# Patient Record
Sex: Male | Born: 1970 | Race: Black or African American | Hispanic: No | Marital: Married | Smoking: Former smoker
Health system: Southern US, Community
[De-identification: ages and names within clinical notes are randomized; demographics above are authoritative.]

## PROBLEM LIST (undated history)

## (undated) DIAGNOSIS — K219 Gastro-esophageal reflux disease without esophagitis: Secondary | ICD-10-CM

## (undated) DIAGNOSIS — I2541 Coronary artery aneurysm: Secondary | ICD-10-CM

## (undated) DIAGNOSIS — T7840XA Allergy, unspecified, initial encounter: Secondary | ICD-10-CM

## (undated) HISTORY — DX: Allergy, unspecified, initial encounter: T78.40XA

## (undated) HISTORY — PX: KNEE SURGERY: SHX244

## (undated) HISTORY — PX: OTHER SURGICAL HISTORY: SHX169

---

## 1998-07-06 ENCOUNTER — Emergency Department (HOSPITAL_COMMUNITY): Admission: EM | Admit: 1998-07-06 | Discharge: 1998-07-06 | Payer: Self-pay | Admitting: Emergency Medicine

## 1998-07-06 ENCOUNTER — Encounter: Payer: Self-pay | Admitting: Emergency Medicine

## 1998-07-09 ENCOUNTER — Emergency Department (HOSPITAL_COMMUNITY): Admission: EM | Admit: 1998-07-09 | Discharge: 1998-07-09 | Payer: Self-pay | Admitting: Emergency Medicine

## 2001-02-11 ENCOUNTER — Emergency Department (HOSPITAL_COMMUNITY): Admission: EM | Admit: 2001-02-11 | Discharge: 2001-02-11 | Payer: Self-pay | Admitting: Emergency Medicine

## 2001-03-05 ENCOUNTER — Emergency Department (HOSPITAL_COMMUNITY): Admission: EM | Admit: 2001-03-05 | Discharge: 2001-03-05 | Payer: Self-pay | Admitting: Emergency Medicine

## 2001-06-27 ENCOUNTER — Emergency Department (HOSPITAL_COMMUNITY): Admission: EM | Admit: 2001-06-27 | Discharge: 2001-06-27 | Payer: Self-pay | Admitting: Emergency Medicine

## 2002-01-05 ENCOUNTER — Inpatient Hospital Stay (HOSPITAL_COMMUNITY): Admission: AD | Admit: 2002-01-05 | Discharge: 2002-01-06 | Payer: Self-pay | Admitting: Internal Medicine

## 2002-12-11 ENCOUNTER — Emergency Department (HOSPITAL_COMMUNITY): Admission: EM | Admit: 2002-12-11 | Discharge: 2002-12-11 | Payer: Self-pay | Admitting: *Deleted

## 2002-12-12 ENCOUNTER — Emergency Department (HOSPITAL_COMMUNITY): Admission: EM | Admit: 2002-12-12 | Discharge: 2002-12-12 | Payer: Self-pay | Admitting: Emergency Medicine

## 2003-01-23 ENCOUNTER — Emergency Department (HOSPITAL_COMMUNITY): Admission: EM | Admit: 2003-01-23 | Discharge: 2003-01-23 | Payer: Self-pay | Admitting: Emergency Medicine

## 2003-02-05 ENCOUNTER — Emergency Department (HOSPITAL_COMMUNITY): Admission: EM | Admit: 2003-02-05 | Discharge: 2003-02-05 | Payer: Self-pay | Admitting: Emergency Medicine

## 2003-04-12 ENCOUNTER — Emergency Department (HOSPITAL_COMMUNITY): Admission: EM | Admit: 2003-04-12 | Discharge: 2003-04-12 | Payer: Self-pay | Admitting: Emergency Medicine

## 2003-05-29 ENCOUNTER — Emergency Department (HOSPITAL_COMMUNITY): Admission: EM | Admit: 2003-05-29 | Discharge: 2003-05-29 | Payer: Self-pay | Admitting: Emergency Medicine

## 2003-06-20 ENCOUNTER — Emergency Department (HOSPITAL_COMMUNITY): Admission: EM | Admit: 2003-06-20 | Discharge: 2003-06-21 | Payer: Self-pay | Admitting: Emergency Medicine

## 2003-12-31 ENCOUNTER — Emergency Department (HOSPITAL_COMMUNITY): Admission: EM | Admit: 2003-12-31 | Discharge: 2004-01-01 | Payer: Self-pay | Admitting: Emergency Medicine

## 2007-02-21 ENCOUNTER — Emergency Department (HOSPITAL_COMMUNITY): Admission: EM | Admit: 2007-02-21 | Discharge: 2007-02-22 | Payer: Self-pay | Admitting: Emergency Medicine

## 2007-03-01 ENCOUNTER — Emergency Department (HOSPITAL_COMMUNITY): Admission: EM | Admit: 2007-03-01 | Discharge: 2007-03-01 | Payer: Self-pay | Admitting: Emergency Medicine

## 2007-10-05 ENCOUNTER — Observation Stay (HOSPITAL_COMMUNITY): Admission: EM | Admit: 2007-10-05 | Discharge: 2007-10-06 | Payer: Self-pay | Admitting: Emergency Medicine

## 2008-10-03 ENCOUNTER — Inpatient Hospital Stay (HOSPITAL_COMMUNITY): Admission: EM | Admit: 2008-10-03 | Discharge: 2008-10-06 | Payer: Self-pay | Admitting: Emergency Medicine

## 2009-01-01 ENCOUNTER — Emergency Department (HOSPITAL_COMMUNITY): Admission: EM | Admit: 2009-01-01 | Discharge: 2009-01-02 | Payer: Self-pay | Admitting: Emergency Medicine

## 2009-08-14 ENCOUNTER — Emergency Department (HOSPITAL_COMMUNITY): Admission: EM | Admit: 2009-08-14 | Discharge: 2009-08-14 | Payer: Self-pay | Admitting: Emergency Medicine

## 2010-12-02 LAB — URINALYSIS, ROUTINE W REFLEX MICROSCOPIC
Glucose, UA: NEGATIVE mg/dL
Leukocytes, UA: NEGATIVE
Nitrite: NEGATIVE
Protein, ur: 30 mg/dL — AB
Specific Gravity, Urine: 1.022 (ref 1.005–1.030)
Urobilinogen, UA: 1 mg/dL (ref 0.0–1.0)
pH: 6 (ref 5.0–8.0)

## 2010-12-02 LAB — URINE MICROSCOPIC-ADD ON

## 2010-12-02 LAB — BASIC METABOLIC PANEL
BUN: 6 mg/dL (ref 6–23)
CO2: 27 mEq/L (ref 19–32)
CO2: 29 mEq/L (ref 19–32)
Calcium: 8.3 mg/dL — ABNORMAL LOW (ref 8.4–10.5)
Calcium: 8.9 mg/dL (ref 8.4–10.5)
Chloride: 110 mEq/L (ref 96–112)
Creatinine, Ser: 1.32 mg/dL (ref 0.4–1.5)
GFR calc Af Amer: >60 mL/min (ref >60)
GFR calc Af Amer: >60 mL/min (ref >60)
GFR calc non Af Amer: >60 mL/min (ref >60)
GFR calc non Af Amer: >60 mL/min (ref >60)
Glucose, Bld: 94 mg/dL (ref 70–99)
Potassium: 3.8 mEq/L (ref 3.5–5.1)
Potassium: 4.2 mEq/L (ref 3.5–5.1)
Sodium: 138 mEq/L (ref 135–145)
Sodium: 141 mEq/L (ref 135–145)

## 2010-12-02 LAB — CBC
HCT: 41.1 % (ref 39.0–52.0)
HCT: 48.4 % (ref 39.0–52.0)
Hemoglobin: 13.8 g/dL (ref 13.0–17.0)
Hemoglobin: 16.1 g/dL (ref 13.0–17.0)
MCHC: 33.2 g/dL (ref 30.0–36.0)
MCHC: 33.6 g/dL (ref 30.0–36.0)
MCV: 95.8 fL (ref 78.0–100.0)
MCV: 96 fL (ref 78.0–100.0)
Platelets: 190 10*3/uL (ref 150–400)
Platelets: 229 10*3/uL (ref 150–400)
RBC: 4.29 MIL/uL (ref 4.22–5.81)
RBC: 5.06 MIL/uL (ref 4.22–5.81)
RDW: 13.9 % (ref 11.5–15.5)
RDW: 14.1 % (ref 11.5–15.5)
WBC: 4.8 10*3/uL (ref 4.0–10.5)
WBC: 6.1 10*3/uL (ref 4.0–10.5)

## 2010-12-02 LAB — POCT I-STAT, CHEM 8
BUN: 3 mg/dL — ABNORMAL LOW (ref 6–23)
Calcium, Ion: 1.17 mmol/L (ref 1.12–1.32)
Chloride: 100 mEq/L (ref 96–112)
Creatinine, Ser: 1.3 mg/dL (ref 0.4–1.5)
Glucose, Bld: 90 mg/dL (ref 70–99)
TCO2: 27 mmol/L (ref 0–100)

## 2010-12-02 LAB — CK TOTAL AND CKMB (NOT AT ARMC)
CK, MB: 12.4 ng/mL — ABNORMAL HIGH (ref 0.3–4.0)
Relative Index: 1.2 (ref 0.0–2.5)
Total CK: 994 U/L — ABNORMAL HIGH (ref 7–232)

## 2010-12-02 LAB — POCT CARDIAC MARKERS: Troponin i, poc: <0.05 ng/mL (ref 0.00–0.09)

## 2010-12-02 LAB — DIFFERENTIAL
Eosinophils Relative: 4 % (ref 0–5)
Lymphocytes Relative: 21 % (ref 12–46)
Lymphs Abs: 1.3 10*3/uL (ref 0.7–4.0)
Monocytes Absolute: 0.3 10*3/uL (ref 0.1–1.0)
Monocytes Relative: 5 % (ref 3–12)
Neutro Abs: 4.3 10*3/uL (ref 1.7–7.7)

## 2010-12-02 LAB — MAGNESIUM: Magnesium: 1.8 mg/dL (ref 1.5–2.5)

## 2010-12-02 LAB — RAPID URINE DRUG SCREEN, HOSP PERFORMED
Amphetamines: NOT DETECTED
Barbiturates: NOT DETECTED

## 2010-12-02 LAB — POTASSIUM: Potassium: 3.9 mEq/L (ref 3.5–5.1)

## 2010-12-02 LAB — CK: Total CK: 648 U/L — ABNORMAL HIGH (ref 7–232)

## 2010-12-30 NOTE — H&P (Signed)
Daniel [name], CAST NO.:  [id number]   MEDICAL RECORD NO.:  [id number]          PATIENT TYPE:  EMS   LOCATION:  ED                           FACILITY:  [facility]   PHYSICIAN:  Sarajane Marek, MD     DATE OF BIRTH:  04-Jul-1971   DATE OF ADMISSION:  10/03/2008  DATE OF DISCHARGE:                              HISTORY & PHYSICAL   CHIEF COMPLAINT:  Bilateral flank pain.   HISTORY OF PRESENT ILLNESS:  Daniel [name] is a 40 year old male with  bilateral flank pain x1 day.  He had one episode last week that resolved  with ibuprofen and a heating pad.  He reports he has had dark urine  today with some decrease in urine volume and had a prior episode 1-year  ago that was rhabdomyolysis secondary to cocaine use and, therefore,  presented today for further evaluation.  In the emergency department, CK  was found to be elevated and he was given IV fluids.  He denies cocaine  use at this time; however, his wife is in the room and his UDS was  positive.   PAST MEDICAL HISTORY:  As per HPI.   ALLERGIES:  NONE.   MEDICATIONS:  Multivitamins once daily.   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  He smokes 1-pack per week of cigarettes.  He has one beer  every 2 days and denies illicit drug use as detailed in the HPI.   FAMILY HISTORY:  His mother is alive with diabetes.  His father is  alive, but his medical history is unknown.   REVIEW OF SYSTEMS:  Negative except as per HPI.   PHYSICAL EXAMINATION:  VITAL SIGNS:  Temperature is 97.8, heart rate is  89, blood pressure is 133/89, respiratory rate 20.  Oxygen saturation is  96% on room air.  GENERAL:  This is a pleasant middle aged male in no acute distress.  EYES:  Extraocular muscles are intact.  Sclerae are clear.  ENT:  Mucous membranes are moist.  Nares are clear.  NECK:  Supple without lymphadenopathy.  No thyromegaly, bruit or JVD.  CARDIOVASCULAR:  Regular rate and rhythm.   No murmur, gallop or rub.  RESPIRATORY:  Clear to auscultation with no increased work of breathing.  ABDOMEN/GI:  Soft, nontender, nondistended.  No rebound, guarding or  mass.  NEURO:  The patient has no deficits.  He is alert and oriented x3.  EXTREMITIES:  2+ pulses.  No lower extremity edema.  PSYCHIATRIC:  Normal mood and affect.   LABORATORY DATA:  Urine drug screen was positive for cocaine.  CK is  994, CK-MB is 12.4 and troponin is less than 0.05.  Sodium was 141,  potassium 3.4, chloride is 100, bicarb is 27, BUN is 3, creatinine is  1.3 and glucose is 90.  White blood cell count is 6, hemoglobin 16,  hematocrit 48 and platelets are 229.  Urinalysis with a specific gravity  of 1.022 and amber in color, pH is 6, glucose negative, ketones trace,  blood was large, protein was  30, urobilinogen is 1, nitrate negative,  leukocytes negative, white blood cell count 0-3.   ASSESSMENT/PLAN:  This is a 40 year old African American male presenting  with bilateral flank pain and rhabdomyolysis secondary to cocaine use.   He will be admitted to [facility] for IV fluid hydration overnight.  We  will continue normal saline at 500 mL per hour and maintain oxygen  saturation greater than 92%.  We will recheck electrolytes and CK in the  morning.  We will offer cocaine and tobacco cessation counseling.      Sarajane Marek, MD  Electronically Signed     HH/MEDQ  D:  10/03/2008  T:  10/03/2008  Job:  [id number]

## 2010-12-30 NOTE — H&P (Signed)
Daniel [name], [name] NO.:  [id number]   MEDICAL RECORD NO.:  [id number]          PATIENT TYPE:  EMS   LOCATION:  MAJO                         FACILITY:  [facility]   PHYSICIAN:  Lucita Ferrara, MD         DATE OF BIRTH:  [birth year]   DATE OF ADMISSION:  10/05/2007  DATE OF DISCHARGE:                              HISTORY & PHYSICAL   PRIMARY CARE DOCTOR:  Unassigned.   Patient is a 40 year old who presents to [facility] with  a chief complaint of chest pain that has been going on for the last 1-2  days.  Chest pain is accompanied with shortness of breath, nausea,  chills, with nonproductive cough.  He denies fevers.  He is a smoker.  He also states that he uses cocaine, and the last time he used cocaine  was one month ago, although his urine drug screen is positive for  cocaine here in the emergency room.  Chest pain is described as pressure-  like in intensity, dull in character.  Intensity is 6/10.  There is no  worsening or alleviating factors associated with chills, cough, dyspnea,  nausea.  There is no diaphoresis.  Patient currently denies using  cocaine over the last 24 hours.  This has never happened before.   PAST MEDICAL HISTORY:  None.   SOCIAL HISTORY:  Patient denies alcohol, but he has been smoking for the  last 12 months.  He is a positive cocaine user.   ALLERGIES:  No known drug allergies.   MEDICATIONS:  None.   REVIEW OF SYSTEMS:  As per HPI, positive for chest pain, dyspnea.  Negative for diaphoresis.  Positive for nausea.  Negative for  musculoskeletal pain.  Negative for rashes.   PHYSICAL EXAMINATION:  GENERAL:  Patient is a young African-American  male in no acute distress.  Sleeping.  Temperature 98, blood pressure 123/58, pulse 93, respirations 24, pulse  ox 97% on room air.  HEENT:  Normocephalic and atraumatic.  Sclerae are anicteric.  PERRLA.  Extraocular muscles are intact.  NECK:  Supple.  No JVD.  No carotid  bruits.  CARDIOVASCULAR:  S1 and S2.  Regular rate and rhythm.  No murmurs, rubs  or clicks.  ABDOMEN:  Soft, nontender, nondistended.  Positive bowel sounds.  LUNGS:  Clear to auscultation bilaterally.  No rales, rhonchi or  wheezes.  EXTREMITIES:  No clubbing, cyanosis or edema.  Patient is alert and oriented x3.  Cranial nerves II-XII are grossly  intact.   Laboratory data shows a urinalysis to show 3-6 red blood cells.  UA has  negative leukocyte esterase and negative nitrates.  Troponin negative at  0.02.  His CK total is 2451.  Urine drug screen is positive for cocaine.  His hemoglobin is 17.3, hematocrit 51.  Sodium 136, potassium 3.6.  BUN  is less than 3.  Creatinine is 1.2.   Chest x-ray is negative, as above.   His EKG is normal sinus rhythm.  No ST-T wave abnormalities.   ASSESSMENT/PLAN:  A 40 year old with chest pain and mild rhabdomyolysis  secondary to positive cocaine and urine.  1. Chest pain:  Will go ahead and admit for 24-hour observation.      Serial cardiac enzymes x3 q.8h.  Monitor cardiac rhythm.  Avoid      beta blockade for now secondary to cocaine and alpha blockage.  2. Mild rhabdomyolysis secondary to cocaine use.  IV fluids for now.      Check I's and O's.  Monitor creatinine and renal function.  May      consider bicarb or further management if the patient develops a non-      gap metabolic acidosis.  3. Positive cocaine.  Patient has been confronted in regards to this,      and patient denies the usage in the last 24-48 hours.  Will go      ahead and get drug abuse counseling in regards to this.  4. Patient is currently hemodynamically stable.  If his total CK is in      the normal range and patient's kidney function is within normal      range, if his cardiac enzymes are normal, then he can be discharged      within 24-hour observation to follow up with primary care doctor.      Lucita Ferrara, MD  Electronically Signed     RR/MEDQ  D:   10/05/2007  T:  10/05/2007  Job:  [id number]

## 2010-12-30 NOTE — Discharge Summary (Signed)
[name], Daniel [name]              ACCOUNT NO.:  [id number]   MEDICAL RECORD NO.:  [id number]          PATIENT TYPE:  INP   LOCATION:  1527                         FACILITY:  [facility]   PHYSICIAN:  Charlestine Massed, MDDATE OF BIRTH:  08-May-1971   DATE OF ADMISSION:  10/03/2008  DATE OF DISCHARGE:  10/06/2008                               DISCHARGE SUMMARY   PRIMARY CARE PHYSICIAN:  Unassigned.  The patient states that he is  going to get health coverage from his job this week and then he will set  up a primary care doctor at that time.   REASON FOR ADMISSION:  Bilateral flank pain.   DISCHARGE DIAGNOSIS:  1. Mild rhabdomyolysis secondary to cocaine use.  2. Mild renal insufficiency, resolved.  3. Cocaine abuse.  4. Tobacco abuse.   DISCHARGE MEDICATIONS:  Magnesium oxide 400 mg p.o. daily for 7 days.   HOSPITAL COURSE BY PROBLEM:  1. Mild rhabdomyolysis.  The patient has a prior history of admission      for rhabdomyolysis secondary to cocaine abuse.  In the current      history admission, the patient has denied any further illicit drug      use.  He takes occasional beer once every 2 days and he continues      to smoke cigarettes, and he denied any cocaine use at this time but      his urine was positive for cocaine when tested, so we put the      diagnosis as rhabdomyolysis secondary to cocaine use, which was      mild.  The patient was admitted for IV fluids and his CPK was less      then 1000.  IV fluids and electrolytes were monitored and he      improved gradually.  His flank pain resolved.  His CPK came down to      around 400, with a pattern which was trending down.  The patient is      clinically stable and he is without any pain.  He works as a Barrister's clerk.  So the patient is being discharged      today.  2. Electrolytes.  All electrolytes were stable.  Magnesium was      slightly on the low side at 1.8.  The patient has been started on   p.o. magnesium.  An EKG did not have any changes, so the patient is      to be continued on magnesium oxide for 7 days.  3. Tobacco use.  The patient has been educated with regard to      abstinence from tobacco.  The patient states he will stop it by      himself.  4. Cocaine abuse.  The patient has been educated that stopping cocaine      will be the best way to avoid further issues, and patient agrees      with that.  Cocaine abstinence and tobacco cessation counseling was      given for a period of 10 minutes.   DISCHARGE  LABS:  Sodium 138, potassium 4.0, chloride 106, bicarb 29,  glucose 89, BUN 7, creatinine 1.28.  Estimated GFR is more than 60.  Calcium 8.9.  Magnesium was 1.8 yesterday, and was 3.6.  CPK total is  406.   UA on admission was positive for large blood but only 3-6 RBCs were  present, which is typical of rhabdomyolysis due to myoglobinuria.   U tox was positive for cocaine.   DISCHARGE INSTRUCTIONS:  1. He has been advised to stay away from tobacco and cocaine.  2. To follow up with a primary care doctor as soon as it is set up.      The patient states that he is getting his health coverage as early      as this week from his job and then he will set up a primary doctor      and follow up with the primary doctor.  3. The patient has been advised to drink plenty of fluids and keep      himself hydrated.  Has been also advised not over take plain water      alone and to have plain water along with salted beverages like      Gatorade has been advised.  The patient understood that clearly.   FOLLOWUP:  Follow up with primary care doctor as soon as he gets a  primary care doctor, and to have blood checked for CPK and basic  metabolic panel at that time.   A total of 60 minutes was spent on discharge and tobacco and cocaine  cessation counseling.      Charlestine Massed, MD  Electronically Signed     UT/MEDQ  D:  10/06/2008  T:  10/06/2008  Job:  [phone]

## 2011-04-25 ENCOUNTER — Emergency Department (HOSPITAL_COMMUNITY)
Admission: EM | Admit: 2011-04-25 | Discharge: 2011-04-25 | Disposition: A | Discharge status: Home / Self Care | Payer: 59 | Attending: Emergency Medicine | Admitting: Emergency Medicine

## 2011-04-25 DIAGNOSIS — M545 Low back pain, unspecified: Secondary | ICD-10-CM | POA: Insufficient documentation

## 2011-04-25 DIAGNOSIS — M549 Dorsalgia, unspecified: Secondary | ICD-10-CM | POA: Insufficient documentation

## 2011-05-08 LAB — URINALYSIS, ROUTINE W REFLEX MICROSCOPIC
Bilirubin Urine: NEGATIVE
Nitrite: NEGATIVE
Specific Gravity, Urine: 1.007
pH: 7.5

## 2011-05-08 LAB — URINE MICROSCOPIC-ADD ON

## 2011-05-08 LAB — I-STAT 8, (EC8 V) (CONVERTED LAB)
BUN: <3 — ABNORMAL LOW
Chloride: 102
Glucose, Bld: 80
Hemoglobin: 17.3 — ABNORMAL HIGH
Potassium: 3.9
Sodium: 136

## 2011-05-08 LAB — CARDIAC PANEL(CRET KIN+CKTOT+MB+TROPI)
CK, MB: 10.7 — ABNORMAL HIGH
CK, MB: 13 — ABNORMAL HIGH
Relative Index: 0.6
Total CK: 1481 — ABNORMAL HIGH
Total CK: 1860 — ABNORMAL HIGH
Total CK: 2183 — ABNORMAL HIGH
Troponin I: 0.01
Troponin I: 0.02

## 2011-05-08 LAB — POCT I-STAT CREATININE
Creatinine, Ser: 1.2
Operator id: 146091

## 2011-05-08 LAB — HEPATIC FUNCTION PANEL
ALT: 44
AST: 75 — ABNORMAL HIGH
Indirect Bilirubin: 0.8
Total Protein: 7.2

## 2011-05-08 LAB — BASIC METABOLIC PANEL
CO2: 27
CO2: 27
Calcium: 8.5
Chloride: 102
Creatinine, Ser: 1.26
Creatinine, Ser: 1.57 — ABNORMAL HIGH
GFR calc Af Amer: >60
GFR calc Af Amer: >60
GFR calc non Af Amer: 51 — ABNORMAL LOW
Potassium: 3.6
Sodium: 137

## 2011-05-08 LAB — RAPID URINE DRUG SCREEN, HOSP PERFORMED
Barbiturates: NOT DETECTED
Opiates: NOT DETECTED

## 2011-05-08 LAB — LIPID PANEL: Triglycerides: 47

## 2011-05-08 LAB — CK TOTAL AND CKMB (NOT AT ARMC): Total CK: 2451 — ABNORMAL HIGH

## 2011-05-08 LAB — POCT CARDIAC MARKERS: Operator id: 146091

## 2011-05-08 LAB — TROPONIN I: Troponin I: 0.02

## 2013-01-09 ENCOUNTER — Emergency Department (HOSPITAL_COMMUNITY)
Admission: EM | Admit: 2013-01-09 | Discharge: 2013-01-09 | Disposition: A | Discharge status: Home / Self Care | Payer: 59 | Attending: Emergency Medicine | Admitting: Emergency Medicine

## 2013-01-09 ENCOUNTER — Encounter (HOSPITAL_COMMUNITY): Payer: Self-pay | Admitting: Emergency Medicine

## 2013-01-09 DIAGNOSIS — Z7982 Long term (current) use of aspirin: Secondary | ICD-10-CM | POA: Insufficient documentation

## 2013-01-09 DIAGNOSIS — F17200 Nicotine dependence, unspecified, uncomplicated: Secondary | ICD-10-CM | POA: Insufficient documentation

## 2013-01-09 DIAGNOSIS — M542 Cervicalgia: Secondary | ICD-10-CM | POA: Insufficient documentation

## 2013-01-09 DIAGNOSIS — M25519 Pain in unspecified shoulder: Secondary | ICD-10-CM | POA: Insufficient documentation

## 2013-01-09 DIAGNOSIS — Z79899 Other long term (current) drug therapy: Secondary | ICD-10-CM | POA: Insufficient documentation

## 2013-01-09 DIAGNOSIS — R209 Unspecified disturbances of skin sensation: Secondary | ICD-10-CM | POA: Insufficient documentation

## 2013-01-09 DIAGNOSIS — M5412 Radiculopathy, cervical region: Secondary | ICD-10-CM | POA: Insufficient documentation

## 2013-01-09 MED ORDER — OXYCODONE-ACETAMINOPHEN 5-325 MG PO TABS: 2.0000 | ORAL_TABLET | ORAL | Status: DC | PRN

## 2013-01-09 MED ORDER — DEXAMETHASONE SODIUM PHOSPHATE 10 MG/ML IJ SOLN
10.0000 mg | Freq: Once | INTRAMUSCULAR | Status: AC
  Administered 2013-01-09: 10 mg via INTRAMUSCULAR
  Filled 2013-01-09: qty 1

## 2013-01-09 NOTE — ED Provider Notes (Addendum)
History     CSN: [id number]  Arrival date & time 01/09/13  0800   First MD Initiated Contact with Patient 01/09/13 [phone]      Chief Complaint  Patient presents with  . Neck Pain  . Shoulder Pain    (Consider location/radiation/quality/duration/timing/severity/associated sxs/prior treatment) HPI Comments: Patient presents with left-sided neck pain. He states it's been gradually worsening over last week. He denies any known injury to his neck. He states the pain is sharp and radiates down his left arm. He has some tingling in the second third and fourth fingers of the left hand. He denies any weakness in his arm. He states the pain is worse with movement of the neck. Especially turning his head to the left. He denies any other back pain he denies a chest pain or shortness of breath. He's been taking Aleve without relief. He denies any past history of neck pain. He has had some problems with his lower back in the past   History reviewed. No pertinent past medical history.  History reviewed. No pertinent past surgical history.  History reviewed. No pertinent family history.  History  Substance Use Topics  . Smoking status: Current Every Day Smoker -- 0.50 packs/day  . Smokeless tobacco: Not on file  . Alcohol Use: No      Review of Systems  Constitutional: Negative for fever, chills, diaphoresis and fatigue.  HENT: Positive for neck pain. Negative for congestion, rhinorrhea and sneezing.   Eyes: Negative.   Respiratory: Negative for cough, chest tightness and shortness of breath.   Cardiovascular: Negative for chest pain and leg swelling.  Gastrointestinal: Negative for nausea, vomiting, abdominal pain, diarrhea and blood in stool.  Genitourinary: Negative for frequency, hematuria, flank pain and difficulty urinating.  Musculoskeletal: Negative for back pain and arthralgias.  Skin: Negative for rash.  Neurological: Positive for numbness. Negative for dizziness, speech  difficulty, weakness and headaches.    Allergies  Review of patient's allergies indicates no known allergies.  Home Medications   Current Outpatient Rx  Name  Route  Sig  Dispense  Refill  . aspirin EC 81 MG tablet   Oral   Take 81 mg by mouth every other day.         . Multiple Vitamin (MULTIVITAMIN WITH MINERALS) TABS   Oral   Take 1 tablet by mouth every evening.         . naproxen sodium (ALEVE) 220 MG tablet   Oral   Take 440 mg by mouth every 8 (eight) hours as needed (For pain.).         Marland Kitchen oxyCODONE-acetaminophen (PERCOCET) 5-325 MG per tablet   Oral   Take 2 tablets by mouth every 4 (four) hours as needed for pain.   20 tablet   0     BP 133/81  Pulse 77  Resp 17  SpO2 98%  Physical Exam  Constitutional: He is oriented to person, place, and time. He appears well-developed and well-nourished.  HENT:  Head: Normocephalic and atraumatic.  Eyes: Pupils are equal, round, and reactive to light.  Neck: Normal range of motion. Neck supple.  Patient has positive tenderness in the suprascapular region on the left. It tracks down to his left shoulder. Although he has no specific tenderness on palpation or range of motion in her shoulder.  Cardiovascular: Normal rate, regular rhythm and normal heart sounds.   Pulmonary/Chest: Effort normal and breath sounds normal. No respiratory distress. He has no wheezes. He has no  rales. He exhibits no tenderness.  Abdominal: Soft. Bowel sounds are normal. There is no tenderness. There is no rebound and no guarding.  Musculoskeletal: Normal range of motion. He exhibits no edema.  Lymphadenopathy:    He has no cervical adenopathy.  Neurological: He is alert and oriented to person, place, and time.  Patient has some subjective decreased sensation to light touch in the second third and fourth fingers of the left hand. He has good motor strength in both arms.  Skin: Skin is warm and dry. No rash noted.  Psychiatric: He has a  normal mood and affect.    ED Course  Procedures (including critical care time)  Labs Reviewed - No data to display No results found.   1. Neck pain on left side       MDM  Patient with radicular neck pain. It sounds musculoskeletal in nature. It's worse with movement. He has no symptoms of acute coronary syndrome. No respiratory symptoms to suggest underlying lung disease. He was given a shot of Decadron here in the ED and given a prescription for Percocet to use at home. He was advised to followup with her primary care physician which he feels like he can do within the next week. I also gave him referral to orthopedics if he has trouble getting into a primary care physician.        Rolan Bucco, MD 01/09/13 1610  Rolan Bucco, MD 01/09/13 [phone]

## 2013-01-09 NOTE — ED Notes (Signed)
Pt c/o sharp left neck and shoulder pain x 1 wk.  States that he thinks it might be a pinched nerve.  States that his first three fingers on his left hand go numb.

## 2014-05-05 ENCOUNTER — Ambulatory Visit (INDEPENDENT_AMBULATORY_CARE_PROVIDER_SITE_OTHER): Payer: 59

## 2014-05-05 ENCOUNTER — Ambulatory Visit (INDEPENDENT_AMBULATORY_CARE_PROVIDER_SITE_OTHER): Payer: 59 | Admitting: Family Medicine

## 2014-05-05 VITALS — BP 128/80 | HR 90 | Temp 97.9°F | Resp 16 | Ht 74.0 in | Wt 295.2 lb

## 2014-05-05 DIAGNOSIS — M25561 Pain in right knee: Secondary | ICD-10-CM

## 2014-05-05 DIAGNOSIS — IMO0002 Reserved for concepts with insufficient information to code with codable children: Secondary | ICD-10-CM

## 2014-05-05 DIAGNOSIS — S8391XA Sprain of unspecified site of right knee, initial encounter: Secondary | ICD-10-CM

## 2014-05-05 DIAGNOSIS — M25569 Pain in unspecified knee: Secondary | ICD-10-CM

## 2014-05-05 MED ORDER — MELOXICAM 15 MG PO TABS: 15.0000 mg | ORAL_TABLET | Freq: Every day | ORAL | Status: DC

## 2014-05-05 MED ORDER — HYDROCODONE-ACETAMINOPHEN 5-325 MG PO TABS
1.0000 | ORAL_TABLET | Freq: Four times a day (QID) | ORAL | Status: DC | PRN
Start: 2014-05-05 — End: 2016-05-06

## 2014-05-05 NOTE — Patient Instructions (Signed)
Medial Collateral Knee Ligament Sprain  with Phase I Rehab The medial collateral ligament (MCL) of the knee helps hold the knee joint in proper alignment and prevents the bones from shifting out of alignment (displacing) to the inside (medially). Injury to the knee may cause a tear in the MCL ligament (sprain). Sprains may heal without treatment, but this often results in a loose joint. Sprains are classified into three categories. Grade 1 sprains cause pain, but the tendon is not lengthened. Grade 2 sprains include a lengthened ligament, due to the ligament being stretched or partially ruptured. With grade 2 sprains, there is still function, although possibly decreased. Grade 3 sprains involve a complete tear of the tendon or muscle, and function is usually impaired. SYMPTOMS   Pain and tenderness on the inner side of the knee.  A "pop," tearing or pulling sensation at the time of injury.  Bruising (contusion) at the site of injury, within 48 hours of injury.  Knee stiffness.  Limping, often walking with the knee bent. CAUSES  An MCL sprain occurs when a force is placed on the ligament that is greater than it can handle. Common mechanisms of injury include:  Direct hit (trauma) to the outer side of the knee, especially if the foot is planted on the ground.  Forceful pivoting of the body and leg, while the foot is planted on the ground. RISK INCREASES WITH:  Contact sports (football, rugby).  Sports that require pivoting or cutting (soccer).  Poor knee strength and flexibility.  Improper equipment use. PREVENTION  Warm up and stretch properly before activity.  Maintain physical fitness:  Strength, flexibility and endurance.  Cardiovascular fitness.  Wear properly fitted protective equipment (correct length of cleats for surface).  Functional braces may be effective in preventing injury. PROGNOSIS  MCL tears usually heal without the need for surgery. Sometimes however,  surgery is required. RELATED COMPLICATIONS  Frequently recurring symptoms, such as the knee giving way, knee instability or knee swelling.  Injury to other structures in the knee joint:  Meniscal cartilage, resulting in locking and swelling of the knee.  Articular cartilage, resulting in knee arthritis.  Other ligaments of the knee.  Injury to nerves, resulting in numbness of the outer leg, foot or ankle and weakness or paralysis, with inability to raise the ankle or toes.  Knee stiffness. TREATMENT Treatment first involves the use of ice and medicine, to reduce pain and inflammation. The use of strengthening and stretching exercises may help reduce pain with activity. These exercises may be performed at home, but referral to a therapist is often advised. You may be advised to walk with crutches until you are able to walk without a limp. Your caregiver may provide you with a hinged knee brace to help regain a full range of motion, while also protecting the injured knee. For severe MCL injuries or injuries that involve other ligaments of the knee, surgery is often advised. MEDICATION  Do not take pain medicine for 7 days before surgery.  Only use over-the-counter pain medicine as directed by your caregiver.  Only use prescription pain relievers as directed and only in needed amounts. HEAT AND COLD  Cold treatment (icing) should be applied for 10 to 15 minutes every 2 to 3 hours for inflammation and pain, and immediately after any activity, that aggravates the symptoms. Use ice packs or an ice massage.  Heat treatment may be used before performing stretching and strengthening activities prescribed by your caregiver, physical therapist or athletic trainer.   Use a heat pack or warm water soak. SEEK MEDICAL CARE IF:   Symptoms get worse or do not improve in 4 to 6 weeks, despite treatment.  New, unexplained symptoms develop. EXERCISES  PHASE I EXERCISES  RANGE OF MOTION (ROM) AND  STRETCHING EXERCISES-Medial Collateral Knee Ligament Sprain Phase I These are some of the initial exercises that your physician, physical therapist or athletic trainer may have you perform to begin your rehabilitation. When you demonstrate gains in your flexibility and strength, your caregiver may progress you to Phase II exercises. As you perform these exercises, remember:  These initial exercises are intended to be gentle. They will help you restore motion without increasing any swelling.  Completing these exercises allows less painful movement and prepares you for the more aggressive strengthening exercises in Phase II.  An effective stretch should be held for at least 30 seconds.  A stretch should never be painful. You should only feel a gentle lengthening or release in the stretched tissue. RANGE OF MOTION-Knee Flexion, Active  Lie on your back with both knees straight. (If this causes back discomfort, bend your healthy knee, placing your foot flat on the floor.)  Slowly slide your heel back toward your buttocks until you feel a gentle stretch in the front of your knee or thigh.  Hold for __________ seconds. Slowly slide your heel back to the starting position. Repeat __________ times. Complete this exercise __________ times per day. STRETCH-Knee Flexion, Supine  Lie on the floor with your right / left heel and foot lightly touching the wall. (Place both feet on the wall if you do not use a door frame.)  Without using any effort, allow gravity to slide your foot down the wall slowly until you feel a gentle stretch in the front of your right / left knee.  Hold this stretch for __________ seconds. Then return the leg to the starting position, using your health leg for help, if needed. Repeat __________ times. Complete this stretch __________ times per day. RANGE OF MOTION-Knee Flexion and Extension, Active-Assisted  Sit on the edge of a table or chair with your thighs firmly supported.  It may be helpful to place a folded towel under the end of your right / left thigh.  Flexion (bending): Place the ankle of your healthy leg on top of the other ankle. Use your healthy leg to gently bend your right / left knee until you feel a mild tension across the top of your knee.  Hold for __________ seconds.  Extension (straightening): Switch your ankles so your right / left leg is on top. Use your healthy leg to straighten your right / left knee until you feel a mild tension on the backside of your knee.  Hold for __________ seconds. Repeat __________ times. Complete this exercise __________ times per day. STRETCH-Knee Extension Sitting  Sit with your right / left leg/heel propped on another chair, coffee table, or foot stool.  Allow your leg muscles to relax, letting gravity straighten out your knee.*  You should feel a stretch behind your right / left knee. Hold this position for __________ seconds. Repeat __________ times. Complete this stretch __________ times per day. *Your physician, physical therapist or athletic trainer may instruct you to place a __________ weight on your thigh, just above your kneecap, to deepen the stretch. STRENGTHENING EXERCISES-Medial Collateral Knee Ligament Sprain Phase I These exercises may help you when beginning to rehabilitate your injury. They may resolve your symptoms with or without further involvement   from your physician, physical therapist or athletic trainer. While completing these exercises, remember:   In order to return to more demanding activities, you will likely need to progress to more challenging exercises. Your physician, physical therapist or athletic trainer will advance your exercises when your tissues show adequate healing and your muscles demonstrate increased strength.  Muscles can gain both the endurance and the strength needed for everyday activities through controlled exercises.  Complete these exercises as instructed by  your physician, physical therapist or athletic trainer. Increase the resistance and repetitions only as guided by your caregiver. STRENGTH-Quadriceps, Isometrics  Lie on your back with your right / left leg extended and your opposite knee bent.  Gradually tense the muscles in the front of your right / left thigh. You should see either your kneecap slide up toward your hip or an increased dimpling just above the knee. This motion will push the back of the knee down toward the floor, mat or bed on which you are lying.  Hold the muscle as tight as you can without increasing your pain for __________ seconds.  Relax the muscles slowly and completely in between each repetition. Repeat __________ times. Complete this exercise __________ times per day. STRENGTH-Quadriceps, Short Arcs  Lie on your back. Place a __________ inch towel roll under your knee so that the knee slightly bends.  Raise only your lower leg by tightening the muscles in the front of your thigh. Do not allow your thigh to rise.  Hold this position for __________ seconds. Repeat __________ times. Complete this exercise __________ times per day. OPTIONAL ANKLE WEIGHTS: Begin with ____________________, but DO NOT exceed ____________________. Increase in 1 pound/0.5 kilogram increments.  STRENGTH--Quadriceps, Straight Leg Raises Quality counts! Watch for signs that the quadriceps muscle is working, to be sure you are strengthening the correct muscles and not "cheating" by substituting with healthier muscles.  Lay on your back with your right / left leg extended and your opposite knee bent.  Tense the muscles in the front of your right / left thigh. You should see either your knee cap slide up or increased dimpling just above the knee. Your thigh may even shake a bit.  Tighten these muscles even more and raise your leg 4 to 6 inches off the floor. Hold for __________ seconds.  Keeping these muscles tense, lower your leg.  Relax  the muscles slowly and completely in between each repetition. Repeat __________ times. Complete this exercise __________ times per day. STRENGTH-Hamstring, Isometrics  Lie on your back on a firm surface.  Bend your right / left knee approximately __________ degrees.  Dig your heel into the surface as if you are trying to pull it toward your buttocks. Tighten the muscles in the back of your thighs to "dig" as hard as you can, without increasing any pain.  Hold this position for __________ seconds.  Release the tension gradually and allow your muscle to completely relax for __________ seconds in between each exercise. Repeat __________ times. Complete this exercise __________ times per day. STRENGTH-Hamstring, Curls  Lay on your stomach with your legs extended. (If you lay on a bed, your feet may hang over the edge.)  Tighten the muscles in the back of your thigh to bend your right / left knee up to 90 degrees. Keep your hips flat on the bed.  Hold this position for __________ seconds.  Slowly lower your leg back to the starting position. Repeat __________ times. Complete this exercise __________ times per day.   OPTIONAL ANKLE WEIGHTS: Begin with ____________________, but DO NOT exceed ____________________. Increase in 1 pound/0.5 kilogram increments.  Document Released: 08/03/2005 Document Revised: 10/26/2011 Document Reviewed: 11/15/2008 [employer] Patient Information 2015 [employer]. This information is not intended to replace advice given to you by your health care provider. Make sure you discuss any questions you have with your health care provider.  

## 2014-05-05 NOTE — Progress Notes (Addendum)
Subjective:  This chart was scribed for Nilda Simmer, MD by Andrew Au, ED Scribe. This patient was seen in room 9 and the patient's care was started at 1:47 PM.  Patient ID: Daniel [name], male    DOB: 23-May-1971, 43 y.o.   MRN: [id number]  Knee Pain    Chief Complaint  Patient presents with   Knee Pain    right   HPI Comments: Daniel [name] is a 43 y.o. male who presents to the [facility] complaining of new worsening right knee pain that began 3-4 days ago with associated swelling. Pt denies recent injury. He reports pain with certain movement and bearing weight which has caused him to limp. Pt reports pain worsens with walking.  Pt reports knee "popped" twice last night.  Pt has taken Aleve and has applied warm compresses. Pt denies warmth to knee. Pt denies right knee injury in the past. Pt does not exercise.  Knee is not giving out but feels like it will give out at times.  Pt works at a [employer] which consist of a little walking; no prolonged walking required.   Past Medical History  Diagnosis Date   Allergy    History reviewed. No pertinent past surgical history. Prior to Admission medications   Medication Sig Start Date End Date Taking? Authorizing Provider  aspirin EC 81 MG tablet Take 81 mg by mouth every other day.   Yes Historical Provider, MD  Multiple Vitamin (MULTIVITAMIN WITH MINERALS) TABS Take 1 tablet by mouth every evening.   Yes Historical Provider, MD  naproxen sodium (ALEVE) 220 MG tablet Take 440 mg by mouth every 8 (eight) hours as needed (For pain.).   Yes Historical Provider, MD  oxyCODONE-acetaminophen (PERCOCET) 5-325 MG per tablet Take 2 tablets by mouth every 4 (four) hours as needed for pain. 01/09/13   Rolan Bucco, MD   Review of Systems  Constitutional: Negative for fever, chills, diaphoresis and fatigue.  Musculoskeletal: Positive for arthralgias, gait problem and joint swelling.  Skin: Negative for color  change and rash.   Objective:   Physical Exam  Nursing note and vitals reviewed. Constitutional: He is oriented to person, place, and time. He appears well-developed and well-nourished. No distress.  HENT:  Head: Normocephalic and atraumatic.  Eyes: Conjunctivae and EOM are normal.  Neck: Neck supple.  Cardiovascular: Normal rate.   Pulmonary/Chest: Effort normal.  Musculoskeletal:       Right knee: He exhibits swelling and effusion ( mild to moderate). He exhibits normal range of motion, no ecchymosis, no deformity, no laceration, no erythema, normal alignment, normal patellar mobility, normal meniscus and no MCL laxity. Tenderness found. Medial joint line tenderness noted. No lateral joint line, no MCL, no LCL and no patellar tendon tenderness noted.  Lachman's negative. McMurrays negative. Valgus and varus strain intact. Gait with limp due to pain.  Full extension and flexion of knee.  Neurological: He is alert and oriented to person, place, and time.  Skin: Skin is warm and dry.  Psychiatric: He has a normal mood and affect. His behavior is normal.   [facility] reading (PRIMARY) by  Dr. Katrinka Blazing.  R KNEE FILMS:  Medial joint space narrowing; no acute process.   Assessment & Plan:  Right knee pain - Plan: DG Knee Complete 4 Views Right  Sprain of right knee, initial encounter - Plan: meloxicam (MOBIC) 15 MG tablet, HYDROcodone-acetaminophen (NORCO/VICODIN) 5-325 MG per tablet   1. R knee pain/strain:  New.  Rx for Mobic  provided to take daily; rx for Hydrocodone also provided for qhs use.  Knee brace provided for support.  Home exercise program provided to start in 3-4 days. If no improvement in 1-2 weeks, call for ortho referral.    I personally performed the services described in this documentation, which was scribed in my presence.  The recorded information has been reviewed and is accurate.  Nilda Simmer, M.D.  [facility] [address] [place], [place]  [address] [phone] phone [phone] fax

## 2014-05-08 ENCOUNTER — Telehealth: Payer: Self-pay

## 2014-05-08 NOTE — Telephone Encounter (Signed)
Spoke to pt- His employer states that he is unable to rtn to work until he is 100%.  Please advise if extension would be acceptable.

## 2014-05-08 NOTE — Telephone Encounter (Signed)
Pt calling to give Maralyn Sago the fax number for his ppw [phone] ATT: delisha; pt asked if Maralyn Sago could please give him a call when this has been done.

## 2014-05-08 NOTE — Telephone Encounter (Signed)
SMITH - Pt said you told him to call back if he wasn't feeling better by today.  He also was given a note to be out of work through today.  He is not feeling any better.  Please advise. [phone]

## 2014-05-08 NOTE — Telephone Encounter (Signed)
Pt notified and letter faxed

## 2014-05-08 NOTE — Telephone Encounter (Signed)
If he is still having problem we can extend the note for 7 days from date of visit and then he needs an OV to evaluate.

## 2016-05-06 ENCOUNTER — Emergency Department (HOSPITAL_COMMUNITY)
Admission: EM | Admit: 2016-05-06 | Discharge: 2016-05-06 | Disposition: A | Discharge status: Home / Self Care | Payer: Commercial Managed Care - HMO | Attending: Emergency Medicine | Admitting: Emergency Medicine

## 2016-05-06 ENCOUNTER — Encounter (HOSPITAL_COMMUNITY): Payer: Self-pay | Admitting: Emergency Medicine

## 2016-05-06 ENCOUNTER — Emergency Department (HOSPITAL_COMMUNITY): Payer: Commercial Managed Care - HMO

## 2016-05-06 DIAGNOSIS — Z79899 Other long term (current) drug therapy: Secondary | ICD-10-CM | POA: Insufficient documentation

## 2016-05-06 DIAGNOSIS — I712 Thoracic aortic aneurysm, without rupture, unspecified: Secondary | ICD-10-CM

## 2016-05-06 DIAGNOSIS — Z87891 Personal history of nicotine dependence: Secondary | ICD-10-CM | POA: Insufficient documentation

## 2016-05-06 DIAGNOSIS — K219 Gastro-esophageal reflux disease without esophagitis: Secondary | ICD-10-CM | POA: Insufficient documentation

## 2016-05-06 DIAGNOSIS — Z7982 Long term (current) use of aspirin: Secondary | ICD-10-CM | POA: Insufficient documentation

## 2016-05-06 DIAGNOSIS — R079 Chest pain, unspecified: Secondary | ICD-10-CM | POA: Diagnosis present

## 2016-05-06 HISTORY — DX: Gastro-esophageal reflux disease without esophagitis: K21.9

## 2016-05-06 LAB — BASIC METABOLIC PANEL
Anion gap: 6 (ref 5–15)
BUN: 13 mg/dL (ref 6–20)
CHLORIDE: 104 mmol/L (ref 101–111)
CO2: 28 mmol/L (ref 22–32)
Calcium: 9.2 mg/dL (ref 8.9–10.3)
Creatinine, Ser: 1.11 mg/dL (ref 0.61–1.24)
GFR calc Af Amer: >60 mL/min (ref >60)
GFR calc non Af Amer: >60 mL/min (ref >60)
GLUCOSE: 95 mg/dL (ref 65–99)
POTASSIUM: 4.1 mmol/L (ref 3.5–5.1)
Sodium: 138 mmol/L (ref 135–145)

## 2016-05-06 LAB — CBC
HEMATOCRIT: 42.1 % (ref 39.0–52.0)
Hemoglobin: 14.5 g/dL (ref 13.0–17.0)
MCH: 30.9 pg (ref 26.0–34.0)
MCHC: 34.4 g/dL (ref 30.0–36.0)
MCV: 89.8 fL (ref 78.0–100.0)
Platelets: 201 10*3/uL (ref 150–400)
RBC: 4.69 MIL/uL (ref 4.22–5.81)
RDW: 13.7 % (ref 11.5–15.5)
WBC: 4.8 10*3/uL (ref 4.0–10.5)

## 2016-05-06 LAB — I-STAT TROPONIN, ED: Troponin i, poc: 0.01 ng/mL (ref 0.00–0.08)

## 2016-05-06 MED ORDER — LANSOPRAZOLE 15 MG PO CPDR: 15.0000 mg | DELAYED_RELEASE_CAPSULE | Freq: Every day | ORAL | 1 refills | Status: DC

## 2016-05-06 MED ORDER — IOPAMIDOL (ISOVUE-370) INJECTION 76%
100.0000 mL | Freq: Once | INTRAVENOUS | Status: AC | PRN
  Administered 2016-05-06: 100 mL via INTRAVENOUS

## 2016-05-06 MED ORDER — GI COCKTAIL ~~LOC~~
30.0000 mL | Freq: Once | ORAL | Status: AC
Start: 2016-05-06 — End: 2016-05-06
  Administered 2016-05-06: 30 mL via ORAL
  Filled 2016-05-06: qty 30

## 2016-05-06 NOTE — ED Provider Notes (Signed)
[facility] Provider Note   CSN: [id number] Arrival date & time: 05/06/16  1601     History   Chief Complaint Chief Complaint  Patient presents with  . Gastroesophageal Reflux    HPI Daniel [name] is a 45 y.o. male. Pt started having pain in the center of the chest yesterday.  Mostly constant. It seems to move up an down.  It would move with swallowing.  The pain is dull.  It seemed to be more constant when lying down.  A little better sitting up.  No shortness of breath.  He tried hot tea and Milk of magnesia this am.  It got a little better but has not resolved.  FMHX of diabetes, no heart disease Past Medical History:  Diagnosis Date  . Allergy   . GERD (gastroesophageal reflux disease)     There are no active problems to display for this patient.   History reviewed. No pertinent surgical history.     Home Medications    Prior to Admission medications   Medication Sig Start Date End Date Taking? Authorizing Provider  aspirin EC 81 MG tablet Take 81 mg by mouth every evening.    Yes Historical Provider, MD  ibuprofen (ADVIL,MOTRIN) 800 MG tablet Take 800 mg by mouth every 8 (eight) hours as needed for pain. 03/10/16  Yes Historical Provider, MD  meloxicam (MOBIC) 15 MG tablet Take 1 tablet (15 mg total) by mouth daily. Patient taking differently: Take 15 mg by mouth daily as needed for pain.  05/05/14  Yes Ethelda Chick, MD  Multiple Vitamin (MULTIVITAMIN WITH MINERALS) TABS Take 1 tablet by mouth every evening.   Yes Historical Provider, MD  naproxen sodium (ALEVE) 220 MG tablet Take 440 mg by mouth every 8 (eight) hours as needed (For pain.).   Yes Historical Provider, MD  vitamin B-12 (CYANOCOBALAMIN) 100 MCG tablet Take 100 mcg by mouth daily.   Yes Historical Provider, MD  lansoprazole (PREVACID) 15 MG capsule Take 1 capsule (15 mg total) by mouth daily at 12 noon. 05/06/16   Linwood Dibbles, MD    Family History Family History  Problem Relation Age of  Onset  . Diabetes Mother     Social History Social History  Substance Use Topics  . Smoking status: Former Smoker    Packs/day: 0.50  . Smokeless tobacco: Never Used  . Alcohol use 1.0 oz/week    2 drink(s) per week     Allergies   Review of patient's allergies indicates no known allergies.   Review of Systems Review of Systems  Constitutional: Negative for fever.  Respiratory: Negative for shortness of breath.   Cardiovascular: Negative for leg swelling.  Gastrointestinal: Negative for abdominal pain.  All other systems reviewed and are negative.    Physical Exam Updated Vital Signs BP 126/83 (BP Location: Left Arm)   Pulse 65   Temp 98.3 F (36.8 C) (Oral)   Resp 17   Ht 6\' 3"  (1.905 m)   Wt 136.1 kg   SpO2 100%   BMI 37.50 kg/m   Physical Exam  Constitutional: He appears well-developed and well-nourished. No distress.  HENT:  Head: Normocephalic and atraumatic.  Right Ear: External ear normal.  Left Ear: External ear normal.  Eyes: Conjunctivae are normal. Right eye exhibits no discharge. Left eye exhibits no discharge. No scleral icterus.  Neck: Neck supple. No tracheal deviation present.  Cardiovascular: Normal rate, regular rhythm and intact distal pulses.   Pulmonary/Chest: Effort normal and  breath sounds normal. No stridor. No respiratory distress. He has no wheezes. He has no rales.  Abdominal: Soft. Bowel sounds are normal. He exhibits no distension. There is no tenderness. There is no rebound and no guarding.  Musculoskeletal: He exhibits no edema or tenderness.  Neurological: He is alert. He has normal strength. No cranial nerve deficit (no facial droop, extraocular movements intact, no slurred speech) or sensory deficit. He exhibits normal muscle tone. He displays no seizure activity. Coordination normal.  Skin: Skin is warm and dry. No rash noted.  Psychiatric: He has a normal mood and affect.  Nursing note and vitals reviewed.    ED  Treatments / Results  Labs (all labs ordered are listed, but only abnormal results are displayed) Labs Reviewed  BASIC METABOLIC PANEL  CBC  I-STAT TROPOININ, ED    EKG  EKG Interpretation  Date/Time:  Wednesday May 06 2016 17:42:34 EDT Ventricular Rate:  79 PR Interval:    QRS Duration: 87 QT Interval:  397 QTC Calculation: 456 R Axis:   65 Text Interpretation:  Sinus rhythm Probable left atrial enlargement Anteroseptal infarct, age indeterminate No significant change since last tracing Confirmed by Usama Harkless  MD-J, Tracen Mahler [phone]([id number]) on 05/06/2016 6:00:30 PM       Radiology Dg Chest 2 View  Result Date: 05/06/2016 CLINICAL DATA:  Chest pain, initial encounter. EXAM: CHEST  2 VIEW COMPARISON:  10/05/2007 FINDINGS: The thoracic aorta appears to be prominent but minimally changed from the previous examination. Lungs are clear without airspace disease or pulmonary edema. The trachea is midline. Heart size is normal. No pleural effusions. No acute bone abnormality. IMPRESSION: No active cardiopulmonary disease. Mild prominence of the thoracic aorta. Cannot exclude mild aneurysmal dilatation of the thoracic aorta. Electronically Signed   By: Richarda OverlieAdam  Henn M.D.   On: 05/06/2016 18:06   Ct Angio Chest Aorta W And/or Wo Contrast  Result Date: 05/06/2016 CLINICAL DATA:  45 year old with chest pain. Aneurysmal dilatation of the aorta on chest x-ray. EXAM: CT ANGIOGRAPHY CHEST WITH CONTRAST TECHNIQUE: Multidetector CT imaging of the chest was performed using the standard protocol during bolus administration of intravenous contrast. Multiplanar CT image reconstructions and MIPs were obtained to evaluate the vascular anatomy. CONTRAST:  100 mL Isovue 370 COMPARISON:  Chest radiograph 05/06/2016 FINDINGS: Cardiovascular: Opacification of the pulmonary arteries is suboptimal for evaluation of pulmonary embolism because there is already contrast in the thoracic aorta. Limited evaluation of the pulmonary  arteries beyond the main and lobar branches. However, there is no evidence for large central pulmonary pulmonary embolism. The aortic root is prominent measuring up to 4.3 cm. Mid ascending thoracic aorta is enlarged measuring 4.2 cm. Aortic arch measures up to 3 cm. Common trunk of the innominate artery and left common carotid artery. Mid descending thoracic aorta measures 2.9 cm. No evidence for an aortic dissection. Mediastinum/Nodes: No evidence for chest lymphadenopathy. Small lymph nodes in the axillary regions. No significant pericardial fluid. Lungs/Pleura: No pleural effusions. Trachea and mainstem bronchi are patent. Subtle ground-glass densities in the right lower lobe probably represent volume loss. There is no significant airspace disease or consolidation in the lungs. No evidence for pulmonary edema. 3 mm nodule at the left lung base on sequence 7, image 77 is nonspecific. Upper Abdomen: Upper abdominal images are unremarkable. Musculoskeletal: No acute abnormality. Review of the MIP images confirms the above findings. IMPRESSION: No acute chest abnormality. Suboptimal evaluation for pulmonary embolism as described. Aneurysmal dilatation of the ascending thoracic aorta measuring up  to 4.2 cm. No evidence for an aortic dissection. Recommend annual imaging followup by CTA or MRA. This recommendation follows 2010 ACCF/AHA/AATS/ACR/ASA/SCA/SCAI/SIR/STS/SVM Guidelines for the Diagnosis and Management of Patients with Thoracic Aortic Disease. Circulation. 2010; 121: e266-e369 3 mm nodule at the left lung base. Nodule is indeterminate. No follow-up needed if patient is low-risk. Non-contrast chest CT can be considered in 12 months if patient is high-risk. This recommendation follows the consensus statement: Guidelines for Management of Incidental Pulmonary Nodules Detected on CT Images: From the Fleischner Society 2017; Radiology 2017; 284:228-243. Electronically Signed   By: Richarda Overlie M.D.   On: 05/06/2016  20:30    Procedures Procedures (including critical care time)  Medications Ordered in ED Medications  iopamidol (ISOVUE-370) 76 % injection 100 mL (100 mLs Intravenous Contrast Given 05/06/16 1931)  gi cocktail (Maalox,Lidocaine,Donnatal) (30 mLs Oral Given 05/06/16 2113)     Initial Impression / Assessment and Plan / ED Course  I have reviewed the triage vital signs and the nursing notes.  Pertinent labs & imaging results that were available during my care of the patient were reviewed by me and considered in my medical decision making (see chart for details).  Clinical Course  Value Comment By Time  WBC: 4.8 (Reviewed) Linwood Dibbles, MD 09/20 1851    Patient's laboratory tests, x-rays and CT scan were reviewed. EKG and labs were reassuring. Chest x-ray showed a possible thoracic aneurysm. CT scan was performed to evaluate that further. No evidence of any dissection or rupture. Patient does have an incidental thoracic aortic aneurysm. There is also an incidental lung nodule. The patient is a nonsmoker and no history of lung cancer in the family. I'll start the patient on antacid medications to help with his symptoms that seem to be related to gastroesophageal reflux. We discussed annual follow-up of his thoracic aneurysm. Patient will follow up with his primary care doctor. I will also given the name of Dr. Tyrone Sage for a possible consultation appointment  Final Clinical Impressions(s) / ED Diagnoses   Final diagnoses:  Gastroesophageal reflux disease, esophagitis presence not specified  Thoracic aortic aneurysm without rupture (HCC)    New Prescriptions New Prescriptions   LANSOPRAZOLE (PREVACID) 15 MG CAPSULE    Take 1 capsule (15 mg total) by mouth daily at 12 noon.     Linwood Dibbles, MD 05/06/16 2120

## 2016-05-06 NOTE — ED Notes (Signed)
Called name to lobby x1 with no response.

## 2016-05-06 NOTE — ED Triage Notes (Signed)
Pt reports he has been burping a lot which causes pain in his chest and pain is constant when he is laying down.  Pt has hx of GERD. Has taken Zantac without relief.

## 2016-05-06 NOTE — Discharge Instructions (Signed)
Follow-up with your primary care doctor next week. As we discussed, you will need to have annual CT scans chest to make sure the aneurysm is stable.  This is an incidental finding and not related to your pain.

## 2016-05-21 ENCOUNTER — Encounter: Payer: Commercial Managed Care - HMO | Admitting: Cardiothoracic Surgery

## 2016-06-09 ENCOUNTER — Encounter: Payer: Commercial Managed Care - HMO | Admitting: Thoracic Surgery (Cardiothoracic Vascular Surgery)

## 2016-06-09 ENCOUNTER — Telehealth: Payer: Self-pay | Admitting: *Deleted

## 2016-06-09 NOTE — Telephone Encounter (Signed)
Patient cancelled twice, patient should call back if he would like to r/s/cm

## 2016-06-23 ENCOUNTER — Encounter: Payer: Commercial Managed Care - HMO | Admitting: Thoracic Surgery (Cardiothoracic Vascular Surgery)

## 2016-07-07 ENCOUNTER — Encounter: Payer: Commercial Managed Care - HMO | Admitting: Thoracic Surgery (Cardiothoracic Vascular Surgery)

## 2016-07-07 ENCOUNTER — Institutional Professional Consult (permissible substitution) (INDEPENDENT_AMBULATORY_CARE_PROVIDER_SITE_OTHER): Payer: Commercial Managed Care - HMO | Admitting: Thoracic Surgery (Cardiothoracic Vascular Surgery)

## 2016-07-07 ENCOUNTER — Encounter: Payer: Self-pay | Admitting: Thoracic Surgery (Cardiothoracic Vascular Surgery)

## 2016-07-07 VITALS — BP 134/86 | Resp 15 | Ht 75.0 in | Wt 300.0 lb

## 2016-07-07 DIAGNOSIS — I712 Thoracic aortic aneurysm, without rupture, unspecified: Secondary | ICD-10-CM

## 2016-07-07 NOTE — Progress Notes (Signed)
PCP is No PCP Per Patient Referring Provider is Linwood DibblesKnapp, Jon, MD  Chief Complaint  Patient presents with  . TAA    noted on CT CHEST @ [facility] ED due to results of CXR done because of complaints of chest pain    HPI: Mr. Ozella Rocksennix is a 45 year old man who is sent for consultation regarding an ascending aneurysm.  Mr. Ozella Rocksennix is a 45 year old gentleman who presented to the emergency room at [facility] back in September. He complained of a dull aching pain in his chest. It did seem worse when he was lying down and it seemed to move when he swallowed. He went to the emergency room. He ruled out for myocardial infarction. He was diagnosed and treated with Prevacid and has had no further pain since then.  While he was in the emergency department, a CT done. It showed no evidence of pulmonary embolus or aortic dissection. His ascending aorta was enlarged.    Past Medical History:  Diagnosis Date  . Allergy   . GERD (gastroesophageal reflux disease)     No past surgical history on file.  Family History  Problem Relation Age of Onset  . Diabetes Mother   No family history of aneurysm  Social History Social History  Substance Use Topics  . Smoking status: Former Smoker    Packs/day: 0.50  . Smokeless tobacco: Never Used     Comment: EVERY THREE DAYS FOR 15 YRS  . Alcohol use 1.0 oz/week    2 Standard drinks or equivalent per week    Current Outpatient Prescriptions  Medication Sig Dispense Refill  . aspirin EC 81 MG tablet Take 81 mg by mouth every evening.     Marland Kitchen. ibuprofen (ADVIL,MOTRIN) 800 MG tablet Take 800 mg by mouth every 8 (eight) hours as needed for pain.    Marland Kitchen. lansoprazole (PREVACID) 15 MG capsule Take 1 capsule (15 mg total) by mouth daily at 12 noon. 14 capsule 1  . meloxicam (MOBIC) 15 MG tablet Take 1 tablet (15 mg total) by mouth daily. (Patient taking differently: Take 15 mg by mouth daily as needed for pain. ) 30 tablet 0  . Multiple Vitamin (MULTIVITAMIN WITH MINERALS)  TABS Take 1 tablet by mouth every evening.    . naproxen sodium (ALEVE) 220 MG tablet Take 440 mg by mouth every 8 (eight) hours as needed (For pain.).    Marland Kitchen. vitamin B-12 (CYANOCOBALAMIN) 100 MCG tablet Take 100 mcg by mouth daily.     No current facility-administered medications for this visit.     No Known Allergies  Review of Systems  Constitutional: Negative for activity change, fatigue and unexpected weight change.  HENT: Negative for trouble swallowing and voice change.   Eyes: Negative for visual disturbance.  Respiratory: Negative for shortness of breath and wheezing.   Cardiovascular: Positive for chest pain. Negative for palpitations and leg swelling.  Gastrointestinal: Positive for abdominal pain (Heartburn). Negative for blood in stool.  Genitourinary: Negative for dysuria and hematuria.  Musculoskeletal: Negative for arthralgias and myalgias.  Neurological: Negative for dizziness, syncope and weakness.  Hematological: Negative for adenopathy. Does not bruise/bleed easily.  All other systems reviewed and are negative.   BP 134/86 (BP Location: Right Arm, Patient Position: Sitting, Cuff Size: Large)   Resp 15   Ht 6\' 3"  (1.905 m)   Wt 300 lb (136.1 kg)   SpO2 96% Comment: ON RA  BMI 37.50 kg/m  Physical Exam  Constitutional: He is oriented to person, place,  and time. He appears well-developed and well-nourished. No distress.  HENT:  Head: Normocephalic and atraumatic.  Mouth/Throat: No oropharyngeal exudate.  Eyes: Conjunctivae and EOM are normal. No scleral icterus.  Neck: Neck supple. No thyromegaly present.  Cardiovascular: Normal rate, regular rhythm, normal heart sounds and intact distal pulses.   No murmur heard. Pulmonary/Chest: Effort normal and breath sounds normal. No respiratory distress. He has no wheezes. He has no rales.  Abdominal: Soft. He exhibits no distension. There is no tenderness.  Musculoskeletal: He exhibits no edema or deformity.   Lymphadenopathy:    He has no cervical adenopathy.  Neurological: He is alert and oriented to person, place, and time. No cranial nerve deficit.  No focal motor deficit  Skin: Skin is warm and dry.  Vitals reviewed.    Diagnostic Tests: CT ANGIOGRAPHY CHEST WITH CONTRAST  TECHNIQUE: Multidetector CT imaging of the chest was performed using the standard protocol during bolus administration of intravenous contrast. Multiplanar CT image reconstructions and MIPs were obtained to evaluate the vascular anatomy.  CONTRAST:  100 mL Isovue 370  COMPARISON:  Chest radiograph 05/06/2016  FINDINGS: Cardiovascular: Opacification of the pulmonary arteries is suboptimal for evaluation of pulmonary embolism because there is already contrast in the thoracic aorta. Limited evaluation of the pulmonary arteries beyond the main and lobar branches. However, there is no evidence for large central pulmonary pulmonary embolism.  The aortic root is prominent measuring up to 4.3 cm. Mid ascending thoracic aorta is enlarged measuring 4.2 cm. Aortic arch measures up to 3 cm. Common trunk of the innominate artery and left common carotid artery. Mid descending thoracic aorta measures 2.9 cm. No evidence for an aortic dissection.  Mediastinum/Nodes: No evidence for chest lymphadenopathy. Small lymph nodes in the axillary regions. No significant pericardial fluid.  Lungs/Pleura: No pleural effusions. Trachea and mainstem bronchi are patent. Subtle ground-glass densities in the right lower lobe probably represent volume loss. There is no significant airspace disease or consolidation in the lungs. No evidence for pulmonary edema. 3 mm nodule at the left lung base on sequence 7, image 77 is nonspecific.  Upper Abdomen: Upper abdominal images are unremarkable.  Musculoskeletal: No acute abnormality.  Review of the MIP images confirms the above findings.  IMPRESSION: No acute chest  abnormality.  Suboptimal evaluation for pulmonary embolism as described.  Aneurysmal dilatation of the ascending thoracic aorta measuring up to 4.2 cm. No evidence for an aortic dissection. Recommend annual imaging followup by CTA or MRA. This recommendation follows 2010 ACCF/AHA/AATS/ACR/ASA/SCA/SCAI/SIR/STS/SVM Guidelines for the Diagnosis and Management of Patients with Thoracic Aortic Disease. Circulation. 2010; 121: e266-e369  3 mm nodule at the left lung base. Nodule is indeterminate. No follow-up needed if patient is low-risk. Non-contrast chest CT can be considered in 12 months if patient is high-risk. This recommendation follows the consensus statement: Guidelines for Management of Incidental Pulmonary Nodules Detected on CT Images: From the Fleischner Society 2017; Radiology 2017; 284:228-243.   Electronically Signed   By: Richarda OverlieAdam  Henn M.D.   On: 05/06/2016 20:30  I personally reviewed the CT angio. I disagree with the measurement of 4.2 cm. The largest true diameter is 3.9 cm. A 4.2 comes from axial measurements of an area of the aorta where there is curvature in the vicinity of the sinuses of Valsalva.  Impression: 45 year old man with a 3.9 cm ascending aorta. He is a large man at 6'3" and 300 pounds, so this aorta may just be at the upper limits of normal rather than  an aneurysm. I do think it's large enough that we should keep an eye on that. He also had a tiny 2.5-3 mm left lower lobe lung nodule. I suspect this just a parenchymal lymph node. He was a smoker in the past but quit. I think it's unlikely that that is a malignancy. To be on the safe side I recommended that we repeat a CT angiogram in the year to look at the ascending aorta and the lung nodule.  I stressed the importance of blood pressure control. He is borderline hypertensive. He is not on any medication. His wife has a blood pressure cuff at home and he can use that to check his blood pressure on a  regular basis.  I emphasized the importance of diet and exercise.  Plan: Return in one year with CT angiogram. If stable at 1 year think he can be followed with MR after that.  Loreli Slot, MD [facility] [phone]

## 2016-07-21 ENCOUNTER — Encounter: Payer: Commercial Managed Care - HMO | Admitting: Thoracic Surgery (Cardiothoracic Vascular Surgery)

## 2016-08-19 ENCOUNTER — Ambulatory Visit (HOSPITAL_COMMUNITY)
Admission: EM | Admit: 2016-08-19 | Discharge: 2016-08-19 | Disposition: A | Discharge status: Home / Self Care | Payer: Commercial Managed Care - HMO | Attending: Emergency Medicine | Admitting: Emergency Medicine

## 2016-08-19 ENCOUNTER — Encounter (HOSPITAL_COMMUNITY): Payer: Self-pay | Admitting: *Deleted

## 2016-08-19 DIAGNOSIS — R05 Cough: Secondary | ICD-10-CM

## 2016-08-19 DIAGNOSIS — R058 Other specified cough: Secondary | ICD-10-CM

## 2016-08-19 MED ORDER — PREDNISONE 50 MG PO TABS: ORAL_TABLET | ORAL | 0 refills | Status: DC

## 2016-08-19 NOTE — ED Triage Notes (Signed)
Had   Diarrhea   And   Stomach  Symptoms          sev  Days  Ago  Developed   scratychy   Throat   Congested        And     A non  Productive   Partially  Helping   The  Symptoms

## 2016-08-19 NOTE — ED Provider Notes (Signed)
[facility]    CSN: [id number] Arrival date & time: 08/19/16  1134     History   Chief Complaint Chief Complaint  Patient presents with  . URI    HPI Daniel [name] is a 46 y.o. male.   HPI He is a 46 year old man here for evaluation of cough. His symptoms started about a week ago with a 24-hour stomach bug. He states that resolved, but then he developed a scratchy throat, nasal congestion, and cough. He has been doing TheraFlu and Mucinex with improvement of everything except the cough. He has had persistent cough and congestion in his chest. Every once in while he will get something up coughing. No fevers. No shortness of breath or wheezing.  Past Medical History:  Diagnosis Date  . Allergy   . GERD (gastroesophageal reflux disease)     There are no active problems to display for this patient.   History reviewed. No pertinent surgical history.     Home Medications    Prior to Admission medications   Medication Sig Start Date End Date Taking? Authorizing Provider  aspirin EC 81 MG tablet Take 81 mg by mouth every evening.     Historical Provider, MD  ibuprofen (ADVIL,MOTRIN) 800 MG tablet Take 800 mg by mouth every 8 (eight) hours as needed for pain. 03/10/16   Historical Provider, MD  meloxicam (MOBIC) 15 MG tablet Take 1 tablet (15 mg total) by mouth daily. Patient taking differently: Take 15 mg by mouth daily as needed for pain.  05/05/14   Ethelda Chick, MD  Multiple Vitamin (MULTIVITAMIN WITH MINERALS) TABS Take 1 tablet by mouth every evening.    Historical Provider, MD  naproxen sodium (ALEVE) 220 MG tablet Take 440 mg by mouth every 8 (eight) hours as needed (For pain.).    Historical Provider, MD  predniSONE (DELTASONE) 50 MG tablet Take 1 pill daily for 5 days. 08/19/16   Charm Rings, MD  vitamin B-12 (CYANOCOBALAMIN) 100 MCG tablet Take 100 mcg by mouth daily.    Historical Provider, MD    Family History Family History  Problem Relation Age of  Onset  . Diabetes Mother     Social History Social History  Substance Use Topics  . Smoking status: Former Smoker    Packs/day: 0.50  . Smokeless tobacco: Never Used     Comment: EVERY THREE DAYS FOR 15 YRS  . Alcohol use 1.0 oz/week    2 Standard drinks or equivalent per week     Allergies   Patient has no known allergies.   Review of Systems Review of Systems As in history of present illness  Physical Exam Triage Vital Signs ED Triage Vitals [08/19/16 1210]  Enc Vitals Group     BP 125/75     Pulse Rate 78     Resp 20     Temp 98.1 F (36.7 C)     Temp Source Oral     SpO2 98 %     Weight      Height      Head Circumference      Peak Flow      Pain Score      Pain Loc      Pain Edu?      Excl. in GC?    No data found.   Updated Vital Signs BP 125/75   Pulse 78   Temp 98.1 F (36.7 C) (Oral)   Resp 20   SpO2 98%  Visual Acuity Right Eye Distance:   Left Eye Distance:   Bilateral Distance:    Right Eye Near:   Left Eye Near:    Bilateral Near:     Physical Exam  Constitutional: He is oriented to person, place, and time. He appears well-developed and well-nourished. No distress.  HENT:  Oropharyngeal erythema, but no exudates. Nasal mucosa is normal.  Neck: Neck supple.  Cardiovascular: Normal rate, regular rhythm and normal heart sounds.   No murmur heard. Pulmonary/Chest: Effort normal and breath sounds normal. No respiratory distress. He has no wheezes. He has no rales.  Lymphadenopathy:    He has no cervical adenopathy.  Neurological: He is alert and oriented to person, place, and time.     UC Treatments / Results  Labs (all labs ordered are listed, but only abnormal results are displayed) Labs Reviewed - No data to display  EKG  EKG Interpretation None       Radiology No results found.  Procedures Procedures (including critical care time)  Medications Ordered in UC Medications - No data to display   Initial  Impression / Assessment and Plan / UC Course  I have reviewed the triage vital signs and the nursing notes.  Pertinent labs & imaging results that were available during my care of the patient were reviewed by me and considered in my medical decision making (see chart for details).  Clinical Course     Treat with prednisone for 5 days. Discussed the cough can linger for several weeks. Return precautions reviewed.  Final Clinical Impressions(s) / UC Diagnoses   Final diagnoses:  Post-viral cough syndrome    New Prescriptions Discharge Medication List as of 08/19/2016 12:45 PM    START taking these medications   Details  predniSONE (DELTASONE) 50 MG tablet Take 1 pill daily for 5 days., Normal         Charm RingsErin J Ihsan Nomura, MD 08/19/16 1310

## 2016-08-19 NOTE — Discharge Instructions (Signed)
The cough and congestion are coming from lingering inflammation in the airways. Take prednisone daily for 5 days. You should see improvement within 2 days of starting the medication. The cough tends to linger for several weeks. Follow-up as needed.

## 2016-12-14 ENCOUNTER — Emergency Department (HOSPITAL_COMMUNITY)
Admission: EM | Admit: 2016-12-14 | Discharge: 2016-12-14 | Disposition: A | Discharge status: Home / Self Care | Payer: Commercial Managed Care - HMO | Attending: Emergency Medicine | Admitting: Emergency Medicine

## 2016-12-14 ENCOUNTER — Emergency Department (HOSPITAL_COMMUNITY): Payer: Commercial Managed Care - HMO

## 2016-12-14 ENCOUNTER — Encounter (HOSPITAL_COMMUNITY): Payer: Self-pay | Admitting: Emergency Medicine

## 2016-12-14 DIAGNOSIS — S62336A Displaced fracture of neck of fifth metacarpal bone, right hand, initial encounter for closed fracture: Secondary | ICD-10-CM

## 2016-12-14 DIAGNOSIS — Z87891 Personal history of nicotine dependence: Secondary | ICD-10-CM | POA: Insufficient documentation

## 2016-12-14 DIAGNOSIS — Y929 Unspecified place or not applicable: Secondary | ICD-10-CM | POA: Diagnosis not present

## 2016-12-14 DIAGNOSIS — Y999 Unspecified external cause status: Secondary | ICD-10-CM | POA: Diagnosis not present

## 2016-12-14 DIAGNOSIS — Z23 Encounter for immunization: Secondary | ICD-10-CM | POA: Diagnosis not present

## 2016-12-14 DIAGNOSIS — W208XXA Other cause of strike by thrown, projected or falling object, initial encounter: Secondary | ICD-10-CM | POA: Insufficient documentation

## 2016-12-14 DIAGNOSIS — S6991XA Unspecified injury of right wrist, hand and finger(s), initial encounter: Secondary | ICD-10-CM | POA: Diagnosis present

## 2016-12-14 DIAGNOSIS — Z7982 Long term (current) use of aspirin: Secondary | ICD-10-CM | POA: Insufficient documentation

## 2016-12-14 DIAGNOSIS — Y9389 Activity, other specified: Secondary | ICD-10-CM | POA: Diagnosis not present

## 2016-12-14 MED ORDER — ACETAMINOPHEN 325 MG PO TABS
650.0000 mg | ORAL_TABLET | Freq: Once | ORAL | Status: AC
  Administered 2016-12-14: 650 mg via ORAL
  Filled 2016-12-14: qty 2

## 2016-12-14 MED ORDER — TETANUS-DIPHTH-ACELL PERTUSSIS 5-2.5-18.5 LF-MCG/0.5 IM SUSP
0.5000 mL | Freq: Once | INTRAMUSCULAR | Status: AC
  Administered 2016-12-14: 0.5 mL via INTRAMUSCULAR
  Filled 2016-12-14: qty 0.5

## 2016-12-14 MED ORDER — NAPROXEN 500 MG PO TABS: 500.0000 mg | ORAL_TABLET | Freq: Two times a day (BID) | ORAL | 0 refills | Status: DC

## 2016-12-14 NOTE — ED Notes (Signed)
Bed: [facility] Expected date:  Expected time:  Means of arrival:  Comments: 

## 2016-12-14 NOTE — ED Provider Notes (Signed)
[facility] Provider Note   CSN: [id number] Arrival date & time: 12/14/16  1478     History   Chief Complaint Chief Complaint  Patient presents with  . Hand Injury    HPI Daniel [name] is a 46 y.o. male.  Daniel [name] is a 46 y.o. Male who is right hand dominant who presents to the ED complaining of right hand pain and swelling after an injury while moving furniture last night. Patient reports he was moving furniture when his hand got nauseous between the wall and the piece of furniture. He complains of pain to the right hand. He is taking nothing for treatment of his symptoms today. He is unsure of when his last tetanus shot was.  He denies other injury, wrist pain, elbow pain, numbness, tingling, weakness.   The history is provided by the patient and medical records. No language interpreter was used.  Hand Injury   Pertinent negatives include no fever.    Past Medical History:  Diagnosis Date  . Allergy   . GERD (gastroesophageal reflux disease)     There are no active problems to display for this patient.   History reviewed. No pertinent surgical history.     Home Medications    Prior to Admission medications   Medication Sig Start Date End Date Taking? Authorizing Provider  aspirin EC 81 MG tablet Take 81 mg by mouth every evening.     Historical Provider, MD  ibuprofen (ADVIL,MOTRIN) 800 MG tablet Take 800 mg by mouth every 8 (eight) hours as needed for pain. 03/10/16   Historical Provider, MD  meloxicam (MOBIC) 15 MG tablet Take 1 tablet (15 mg total) by mouth daily. Patient taking differently: Take 15 mg by mouth daily as needed for pain.  05/05/14   Ethelda Chick, MD  Multiple Vitamin (MULTIVITAMIN WITH MINERALS) TABS Take 1 tablet by mouth every evening.    Historical Provider, MD  naproxen (NAPROSYN) 500 MG tablet Take 1 tablet (500 mg total) by mouth 2 (two) times daily with a meal. 12/14/16   Everlene Farrier, PA-C  naproxen sodium (ALEVE) 220  MG tablet Take 440 mg by mouth every 8 (eight) hours as needed (For pain.).    Historical Provider, MD  predniSONE (DELTASONE) 50 MG tablet Take 1 pill daily for 5 days. 08/19/16   Charm Rings, MD  vitamin B-12 (CYANOCOBALAMIN) 100 MCG tablet Take 100 mcg by mouth daily.    Historical Provider, MD    Family History Family History  Problem Relation Age of Onset  . Diabetes Mother     Social History Social History  Substance Use Topics  . Smoking status: Former Smoker    Packs/day: 0.50  . Smokeless tobacco: Never Used     Comment: EVERY THREE DAYS FOR 15 YRS  . Alcohol use 1.0 oz/week    2 Standard drinks or equivalent per week     Allergies   Patient has no known allergies.   Review of Systems Review of Systems  Constitutional: Negative for fever.  Musculoskeletal: Positive for arthralgias and joint swelling.  Skin: Negative for rash.  Neurological: Negative for weakness and numbness.     Physical Exam Updated Vital Signs BP 132/88 (BP Location: Left Arm)   Pulse 93   Temp 98.6 F (37 C) (Oral)   Resp 18   Ht  (1.905 m)   Wt 136.1 kg   SpO2 96%   BMI 37.50 kg/m   Physical Exam  Constitutional: He appears well-developed and well-nourished. No distress.  HENT:  Head: Normocephalic and atraumatic.  Eyes: Right eye exhibits no discharge. Left eye exhibits no discharge.  Cardiovascular: Normal rate, regular rhythm and intact distal pulses.   Bilateral radial pulses are intact. Good capillary refill to his bilateral fingertips.  Pulmonary/Chest: Effort normal. No respiratory distress.  Musculoskeletal: Normal range of motion. He exhibits edema and tenderness. He exhibits no deformity.  Patient has edema and tenderness overlying his fourth and fifth metacarpals of his right hand. There is a superficial abrasion noted to his right hand overlying his fifth metacarpal. No deformity noted. He is able to make a fist with his knuckles in alignment.   Neurological: He  is alert. No sensory deficit. He exhibits normal muscle tone. Coordination normal.  Sensation is intact to his bilateral distal fingertips.   Skin: Skin is warm and dry. Capillary refill takes less than 2 seconds. No rash noted. He is not diaphoretic. No erythema.  Psychiatric: He has a normal mood and affect. His behavior is normal.  Nursing note and vitals reviewed.    ED Treatments / Results  Labs (all labs ordered are listed, but only abnormal results are displayed) Labs Reviewed - No data to display  EKG  EKG Interpretation None       Radiology Dg Hand Complete Right  Result Date: 12/14/2016 CLINICAL DATA:  Hand injury. EXAM: RIGHT HAND - COMPLETE 3+ VIEW COMPARISON:  None. FINDINGS: Fracture of the distal fifth metacarpal with mild displacement and angulation. No other fracture or arthropathy. IMPRESSION: Fracture distal fifth metacarpal. Electronically Signed   By: Marlan Palau M.D.   On: 12/14/2016 08:47    Procedures Procedures (including critical care time)  SPLINT APPLICATION Date/Time: 9:34 AM Authorized by: Lawana Chambers Consent: Verbal consent obtained. Risks and benefits: risks, benefits and alternatives were discussed Consent given by: patient Splint applied by: orthopedic technician Location details: Right hand Splint type: Ulnar gutter  Post-procedure: The splinted body part was neurovascularly unchanged following the procedure. Patient tolerance: Patient tolerated the procedure well with no immediate complications.    Medications Ordered in ED Medications  acetaminophen (TYLENOL) tablet 650 mg (not administered)  Tdap (BOOSTRIX) injection 0.5 mL (not administered)     Initial Impression / Assessment and Plan / ED Course  I have reviewed the triage vital signs and the nursing notes.  Pertinent labs & imaging results that were available during my care of the patient were reviewed by me and considered in my medical decision making (see  chart for details).    This is a 46 y.o. Male who is right hand dominant who presents to the ED complaining of right hand pain and swelling after an injury while moving furniture last night. Patient reports he was moving furniture when his hand got nauseous between the wall and the piece of furniture. He complains of pain to the right hand. He is taking nothing for treatment of his symptoms today. He is unsure of when his last tetanus shot was. On exam the patient is afebrile and nontoxic appearing. He has edema and tenderness overlying his fourth and fifth metacarpals of his right hand. He is able to make a fist in his knuckles are in alignment. His sensation and capillary refill is intact. X-ray shows fifth metacarpal fracture at the neck. There is a superficial abrasion overlying his fifth metacarpal. This does not appear to be an open fracture. Tetanus updated in the emergency department. Patient placed in an ulnar  gutter splint. He tolerated the splint well and has good capillary refill and sensation at recheck by myself. We'll discharge at this time and have him follow-up with orthopedic hand surgeon Dr. Melvyn Novas. I discussed splint care and precautions. I advised the patient to follow-up with their primary care provider this week. I advised the patient to return to the emergency department with new or worsening symptoms or new concerns. The patient verbalized understanding and agreement with plan.     This patient was discussed with Dr. Freida Busman who agrees with assessment and plan.   Final Clinical Impressions(s) / ED Diagnoses   Final diagnoses:  Closed displaced fracture of neck of fifth metacarpal bone of right hand, initial encounter    New Prescriptions New Prescriptions   NAPROXEN (NAPROSYN) 500 MG TABLET    Take 1 tablet (500 mg total) by mouth 2 (two) times daily with a meal.     Everlene Farrier, PA-C 12/14/16 0454    Lorre Nick, MD 12/16/16 [phone]

## 2016-12-14 NOTE — ED Triage Notes (Signed)
patient reports dropping furniture on right hand last night. Patient has swelling noted to hand.

## 2017-05-12 ENCOUNTER — Other Ambulatory Visit: Payer: Self-pay | Admitting: Nurse Practitioner

## 2017-05-12 ENCOUNTER — Ambulatory Visit
Admission: RE | Admit: 2017-05-12 | Discharge: 2017-05-12 | Disposition: A | Discharge status: Home / Self Care | Payer: Worker's Compensation | Source: Ambulatory Visit | Attending: Nurse Practitioner | Admitting: Nurse Practitioner

## 2017-05-12 DIAGNOSIS — M79602 Pain in left arm: Secondary | ICD-10-CM

## 2017-05-12 DIAGNOSIS — R200 Anesthesia of skin: Secondary | ICD-10-CM

## 2017-05-12 DIAGNOSIS — M25512 Pain in left shoulder: Secondary | ICD-10-CM

## 2017-06-10 ENCOUNTER — Other Ambulatory Visit: Payer: Self-pay | Admitting: Thoracic Surgery (Cardiothoracic Vascular Surgery)

## 2017-06-10 DIAGNOSIS — I712 Thoracic aortic aneurysm, without rupture, unspecified: Secondary | ICD-10-CM

## 2017-06-10 DIAGNOSIS — I77810 Thoracic aortic ectasia: Secondary | ICD-10-CM

## 2017-07-13 ENCOUNTER — Other Ambulatory Visit: Payer: Self-pay

## 2017-07-13 ENCOUNTER — Ambulatory Visit: Payer: Self-pay | Admitting: Thoracic Surgery (Cardiothoracic Vascular Surgery)

## 2017-10-05 DIAGNOSIS — G5622 Lesion of ulnar nerve, left upper limb: Secondary | ICD-10-CM | POA: Insufficient documentation

## 2017-12-21 DIAGNOSIS — G5622 Lesion of ulnar nerve, left upper limb: Secondary | ICD-10-CM | POA: Insufficient documentation

## 2018-07-12 ENCOUNTER — Encounter (HOSPITAL_COMMUNITY): Payer: Self-pay

## 2018-07-12 ENCOUNTER — Ambulatory Visit (HOSPITAL_COMMUNITY)
Admission: EM | Admit: 2018-07-12 | Discharge: 2018-07-12 | Disposition: A | Discharge status: Home / Self Care | Payer: 59 | Attending: Family Medicine | Admitting: Family Medicine

## 2018-07-12 DIAGNOSIS — R51 Headache: Secondary | ICD-10-CM | POA: Diagnosis not present

## 2018-07-12 DIAGNOSIS — R519 Headache, unspecified: Secondary | ICD-10-CM

## 2018-07-12 MED ORDER — KETOROLAC TROMETHAMINE 30 MG/ML IJ SOLN
30.0000 mg | Freq: Once | INTRAMUSCULAR | Status: AC
  Administered 2018-07-12: 30 mg via INTRAMUSCULAR

## 2018-07-12 MED ORDER — FLUTICASONE PROPIONATE 50 MCG/ACT NA SUSP: 2.0000 | Freq: Every day | NASAL | 0 refills | Status: DC

## 2018-07-12 MED ORDER — DEXAMETHASONE SODIUM PHOSPHATE 10 MG/ML IJ SOLN
10.0000 mg | Freq: Once | INTRAMUSCULAR | Status: AC
  Administered 2018-07-12: 10 mg via INTRAMUSCULAR

## 2018-07-12 MED ORDER — KETOROLAC TROMETHAMINE 60 MG/2ML IM SOLN
INTRAMUSCULAR | Status: AC
  Filled 2018-07-12: qty 2

## 2018-07-12 MED ORDER — METOCLOPRAMIDE HCL 5 MG/ML IJ SOLN
INTRAMUSCULAR | Status: AC
Start: 2018-07-12 — End: ?
  Filled 2018-07-12: qty 2

## 2018-07-12 MED ORDER — METOCLOPRAMIDE HCL 5 MG/ML IJ SOLN
5.0000 mg | Freq: Once | INTRAMUSCULAR | Status: AC
  Administered 2018-07-12: 5 mg via INTRAMUSCULAR

## 2018-07-12 MED ORDER — DEXAMETHASONE SODIUM PHOSPHATE 10 MG/ML IJ SOLN
INTRAMUSCULAR | Status: AC
  Filled 2018-07-12: qty 1

## 2018-07-12 NOTE — ED Provider Notes (Signed)
[facility]    CSN: [id number] Arrival date & time: 07/12/18  0944     History   Chief Complaint Chief Complaint  Patient presents with  . Migraine    HPI Daniel [name] is a 47 y.o. male.   47 year old male comes in for 1 week history of intermittent headache.  States has all been left-sided, stabbing pain.  When this occurs, can get some photophobia, phonophobia.  Denies nausea or vomiting.  Denies blurry vision/vision changes, diplopia, jaw pain.  States has had these headaches in the past, but never this frequent.  Denies worse headache in his life.  He gets some nasal congestion at nighttime, thinks it may be due to the heater.  Denies other URI symptoms such as cough, rhinorrhea, sore throat.  Denies fever, chills, night sweats.  Has not found a specific trigger that is causing these headaches.  Occasional alcohol use.  Aleve without relief.     Past Medical History:  Diagnosis Date  . Allergy   . GERD (gastroesophageal reflux disease)     There are no active problems to display for this patient.   Past Surgical History:  Procedure Laterality Date  . Elbow Left   . KNEE SURGERY Right        Home Medications    Prior to Admission medications   Medication Sig Start Date End Date Taking? Authorizing Provider  aspirin EC 81 MG tablet Take 81 mg by mouth every evening.     [provider]  fluticasone (FLONASE) 50 MCG/ACT nasal spray Place 2 sprays into both nostrils daily. 07/12/18   Cathie HoopsYu, Buena Boehm V, PA-C  ibuprofen (ADVIL,MOTRIN) 800 MG tablet Take 800 mg by mouth every 8 (eight) hours as needed for pain. 03/10/16   [provider]  Multiple Vitamin (MULTIVITAMIN WITH MINERALS) TABS Take 1 tablet by mouth every evening.    [provider]  naproxen sodium (ALEVE) 220 MG tablet Take 440 mg by mouth every 8 (eight) hours as needed (For pain.).    [provider]  predniSONE (DELTASONE) 50 MG tablet Take 1 pill daily for 5  days. 08/19/16   Charm RingsHonig, Erin J, MD  vitamin B-12 (CYANOCOBALAMIN) 100 MCG tablet Take 100 mcg by mouth daily.    [provider]    Family History Family History  Problem Relation Age of Onset  . Diabetes Mother     Social History Social History   Tobacco Use  . Smoking status: Former Smoker    Packs/day: 0.50  . Smokeless tobacco: Never Used  . Tobacco comment: EVERY THREE DAYS FOR 15 YRS  Substance Use Topics  . Alcohol use: Yes    Alcohol/week: 2.0 standard drinks    Types: 2 Standard drinks or equivalent per week  . Drug use: No     Allergies   Patient has no known allergies.   Review of Systems Review of Systems  Reason unable to perform ROS: See HPI as above.     Physical Exam Triage Vital Signs ED Triage Vitals  Enc Vitals Group     BP 07/12/18 1122 (!) 131/97     Pulse Rate 07/12/18 1122 67     Resp 07/12/18 1122 20     Temp 07/12/18 1122 97.8 F (36.6 C)     Temp Source 07/12/18 1122 Oral     SpO2 07/12/18 1122 99 %     Weight --      Height --  Head Circumference --      Peak Flow --      Pain Score 07/12/18 1125 8     Pain Loc --      Pain Edu? --      Excl. in GC? --    No data found.  Updated Vital Signs BP (!) 131/97 (BP Location: Right Arm)   Pulse 67   Temp 97.8 F (36.6 C) (Oral)   Resp 20   SpO2 99%   Physical Exam  Constitutional: He is oriented to person, place, and time. He appears well-developed and well-nourished. No distress.  HENT:  Head: Normocephalic and atraumatic.  Right Ear: Tympanic membrane, external ear and ear canal normal. Tympanic membrane is not erythematous and not bulging.  Left Ear: Tympanic membrane, external ear and ear canal normal. Tympanic membrane is not erythematous and not bulging.  Nose: Nose normal. Right sinus exhibits no maxillary sinus tenderness and no frontal sinus tenderness. Left sinus exhibits no maxillary sinus tenderness and no frontal sinus tenderness.  Mouth/Throat: Uvula  is midline, oropharynx is clear and moist and mucous membranes are normal.  No tenderness to palpation of the temporal region.  Eyes: Pupils are equal, round, and reactive to light. Conjunctivae are normal.  Neck: Normal range of motion. Neck supple.  Cardiovascular: Normal rate, regular rhythm and normal heart sounds. Exam reveals no gallop and no friction rub.  No murmur heard. Pulmonary/Chest: Effort normal and breath sounds normal. He has no decreased breath sounds. He has no wheezes. He has no rhonchi. He has no rales.  Lymphadenopathy:    He has no cervical adenopathy.  Neurological: He is alert and oriented to person, place, and time. He has normal strength. He is not disoriented. No cranial nerve deficit or sensory deficit. He displays a negative Romberg sign. Coordination and gait normal. GCS eye subscore is 4. GCS verbal subscore is 5. GCS motor subscore is 6.  Normal rapid movement, finger-to-nose.  Able to ambulate on own without difficulty.  Skin: Skin is warm and dry. He is not diaphoretic.  Psychiatric: He has a normal mood and affect. His behavior is normal. Judgment normal.     UC Treatments / Results  Labs (all labs ordered are listed, but only abnormal results are displayed) Labs Reviewed - No data to display  EKG None  Radiology No results found.  Procedures Procedures (including critical care time)  Medications Ordered in UC Medications  ketorolac (TORADOL) 30 MG/ML injection 30 mg (30 mg Intramuscular Given 07/12/18 1232)  dexamethasone (DECADRON) injection 10 mg (10 mg Intramuscular Given 07/12/18 1232)  metoCLOPramide (REGLAN) injection 5 mg (5 mg Intramuscular Given 07/12/18 1231)    Initial Impression / Assessment and Plan / UC Course  I have reviewed the triage vital signs and the nursing notes.  Pertinent labs & imaging results that were available during my care of the patient were reviewed by me and considered in my medical decision making (see  chart for details).    No alarming signs on exam.  Toradol, Reglan, Decadron injection in office today.  Other symptomatic treatment discussed.  Push fluids.  Return precautions given.  Patient expresses understanding and agrees to plan.  Final Clinical Impressions(s) / UC Diagnoses   Final diagnoses:  Acute intractable headache, unspecified headache type    ED Prescriptions    Medication Sig Dispense Auth. Provider   fluticasone (FLONASE) 50 MCG/ACT nasal spray Place 2 sprays into both nostrils daily. 1 g Belinda Fisher, PA-C  Belinda Fisher, PA-C 07/12/18 1258

## 2018-07-12 NOTE — ED Triage Notes (Signed)
Patient has been having headaches on his left temple side that started over a week ago.

## 2018-07-12 NOTE — Discharge Instructions (Signed)
No alarming signs on exam. Toradol, decadron, reglan injection in office today. Start flonse for possible sinus inflammation causing symptoms. As discussed, this could also be caused by dehydration, migraines. Keep hydrated, your urine should be clear to pale yellow in color. Monitor for any triggers/patterns to the headache, and keep diary of symptoms and triggers. Monitor alcohol use as this can also increase symptoms. Follow up with PCP for further evaluation if continues with frequent headaches. If experiencing worse headache of your life, blurry vision, weakness, dizziness, passing out, confusion, go to the emergency department for further evaluation needed.

## 2018-07-16 ENCOUNTER — Encounter (HOSPITAL_COMMUNITY): Payer: Self-pay

## 2018-07-16 ENCOUNTER — Emergency Department (HOSPITAL_COMMUNITY)
Admission: EM | Admit: 2018-07-16 | Discharge: 2018-07-16 | Disposition: A | Discharge status: Home / Self Care | Payer: 59 | Attending: Emergency Medicine | Admitting: Emergency Medicine

## 2018-07-16 ENCOUNTER — Other Ambulatory Visit: Payer: Self-pay

## 2018-07-16 ENCOUNTER — Emergency Department (HOSPITAL_COMMUNITY): Payer: 59

## 2018-07-16 DIAGNOSIS — Z7982 Long term (current) use of aspirin: Secondary | ICD-10-CM | POA: Diagnosis not present

## 2018-07-16 DIAGNOSIS — Z87891 Personal history of nicotine dependence: Secondary | ICD-10-CM | POA: Insufficient documentation

## 2018-07-16 DIAGNOSIS — R51 Headache: Secondary | ICD-10-CM | POA: Diagnosis not present

## 2018-07-16 DIAGNOSIS — Z79899 Other long term (current) drug therapy: Secondary | ICD-10-CM | POA: Diagnosis not present

## 2018-07-16 DIAGNOSIS — R519 Headache, unspecified: Secondary | ICD-10-CM

## 2018-07-16 LAB — COMPREHENSIVE METABOLIC PANEL
ALT: 31 U/L (ref 0–44)
AST: 28 U/L (ref 15–41)
Albumin: 4.3 g/dL (ref 3.5–5.0)
Alkaline Phosphatase: 57 U/L (ref 38–126)
Anion gap: 11 (ref 5–15)
BILIRUBIN TOTAL: 0.7 mg/dL (ref 0.3–1.2)
BUN: 11 mg/dL (ref 6–20)
CALCIUM: 9.3 mg/dL (ref 8.9–10.3)
CO2: 25 mmol/L (ref 22–32)
Chloride: 103 mmol/L (ref 98–111)
Creatinine, Ser: 0.99 mg/dL (ref 0.61–1.24)
GFR calc Af Amer: >60 mL/min (ref >60)
Glucose, Bld: 117 mg/dL — ABNORMAL HIGH (ref 70–99)
POTASSIUM: 3.9 mmol/L (ref 3.5–5.1)
Sodium: 139 mmol/L (ref 135–145)
Total Protein: 7.6 g/dL (ref 6.5–8.1)

## 2018-07-16 LAB — CBC WITH DIFFERENTIAL/PLATELET
Abs Immature Granulocytes: 0.01 10*3/uL (ref 0.00–0.07)
Basophils Absolute: 0 10*3/uL (ref 0.0–0.1)
Basophils Relative: 1 %
EOS ABS: 0.1 10*3/uL (ref 0.0–0.5)
EOS PCT: 3 %
HEMATOCRIT: 49.2 % (ref 39.0–52.0)
Hemoglobin: 16.2 g/dL (ref 13.0–17.0)
IMMATURE GRANULOCYTES: 0 %
LYMPHS ABS: 1.2 10*3/uL (ref 0.7–4.0)
Lymphocytes Relative: 26 %
MCH: 31 pg (ref 26.0–34.0)
MCHC: 32.9 g/dL (ref 30.0–36.0)
MCV: 94.3 fL (ref 80.0–100.0)
MONOS PCT: 4 %
Monocytes Absolute: 0.2 10*3/uL (ref 0.1–1.0)
Neutro Abs: 3.2 10*3/uL (ref 1.7–7.7)
Neutrophils Relative %: 66 %
Platelets: 230 10*3/uL (ref 150–400)
RBC: 5.22 MIL/uL (ref 4.22–5.81)
RDW: 13.5 % (ref 11.5–15.5)
WBC: 4.7 10*3/uL (ref 4.0–10.5)
nRBC: 0 % (ref 0.0–0.2)

## 2018-07-16 MED ORDER — PROCHLORPERAZINE EDISYLATE 10 MG/2ML IJ SOLN
10.0000 mg | Freq: Once | INTRAMUSCULAR | Status: AC
  Administered 2018-07-16: 10 mg via INTRAVENOUS
  Filled 2018-07-16: qty 2

## 2018-07-16 MED ORDER — DIPHENHYDRAMINE HCL 50 MG/ML IJ SOLN
25.0000 mg | Freq: Once | INTRAMUSCULAR | Status: AC
  Administered 2018-07-16: 25 mg via INTRAVENOUS
  Filled 2018-07-16: qty 1

## 2018-07-16 MED ORDER — BUTALBITAL-APAP-CAFFEINE 50-325-40 MG PO TABS: 1.0000 | ORAL_TABLET | Freq: Four times a day (QID) | ORAL | 0 refills | Status: DC | PRN

## 2018-07-16 MED ORDER — DEXAMETHASONE SODIUM PHOSPHATE 10 MG/ML IJ SOLN
10.0000 mg | Freq: Once | INTRAMUSCULAR | Status: AC
  Administered 2018-07-16: 10 mg via INTRAVENOUS
  Filled 2018-07-16: qty 1

## 2018-07-16 MED ORDER — SODIUM CHLORIDE 0.9 % IV BOLUS
1000.0000 mL | Freq: Once | INTRAVENOUS | Status: AC
  Administered 2018-07-16: 1000 mL via INTRAVENOUS

## 2018-07-16 MED ORDER — MAGNESIUM SULFATE IN D5W 1-5 GM/100ML-% IV SOLN
1.0000 g | Freq: Once | INTRAVENOUS | Status: AC
  Administered 2018-07-16: 1 g via INTRAVENOUS
  Filled 2018-07-16: qty 100

## 2018-07-16 MED ORDER — KETOROLAC TROMETHAMINE 30 MG/ML IJ SOLN
15.0000 mg | Freq: Once | INTRAMUSCULAR | Status: AC
  Administered 2018-07-16: 15 mg via INTRAVENOUS
  Filled 2018-07-16: qty 1

## 2018-07-16 NOTE — ED Provider Notes (Signed)
[facility]-EMERGENCY DEPT Provider Note   CSN: [id number] Arrival date & time: 07/16/18  0606     History   Chief Complaint Chief Complaint  Patient presents with  . Headache    HPI Daniel [name] is a 47 y.o. male.  HPI Daniel [name] is a 47 y.o. male presents to ED with complaint of headache. Pt with intermittent headaches for last few weeks. States headache is frontal, mainly in left side, radiates around to the neck. No photophobia. Pain is worse at night time and wakes him up from sleep. Initially helped with ibuprofen, now nothing is helping. Pt states he wen to UC where he received toradol, decadron, reglan. States pain improved and is was pain free until yesterday. States pain came back again yesterday and became more severe at nighttime and woke him up from sleep.  Denies any nausea or vomiting.  No dizziness.  No changes in vision.  No numbness or weakness in extremities.  Did not take anything prior to coming in.  Past Medical History:  Diagnosis Date  . Allergy   . GERD (gastroesophageal reflux disease)     There are no active problems to display for this patient.   Past Surgical History:  Procedure Laterality Date  . Elbow Left   . KNEE SURGERY Right         Home Medications    Prior to Admission medications   Medication Sig Start Date End Date Taking? Authorizing Provider  aspirin EC 81 MG tablet Take 81 mg by mouth every evening.     [provider]  fluticasone (FLONASE) 50 MCG/ACT nasal spray Place 2 sprays into both nostrils daily. 07/12/18   Cathie Hoops, Amy V, PA-C  ibuprofen (ADVIL,MOTRIN) 800 MG tablet Take 800 mg by mouth every 8 (eight) hours as needed for pain. 03/10/16   [provider]  Multiple Vitamin (MULTIVITAMIN WITH MINERALS) TABS Take 1 tablet by mouth every evening.    [provider]  naproxen sodium (ALEVE) 220 MG tablet Take 440 mg by mouth every 8 (eight) hours as needed (For pain.).     [provider]  predniSONE (DELTASONE) 50 MG tablet Take 1 pill daily for 5 days. 08/19/16   Charm Rings, MD  vitamin B-12 (CYANOCOBALAMIN) 100 MCG tablet Take 100 mcg by mouth daily.    [provider]    Family History Family History  Problem Relation Age of Onset  . Diabetes Mother     Social History Social History   Tobacco Use  . Smoking status: Former Smoker    Packs/day: 0.50  . Smokeless tobacco: Never Used  . Tobacco comment: EVERY THREE DAYS FOR 15 YRS  Substance Use Topics  . Alcohol use: Yes    Alcohol/week: 2.0 standard drinks    Types: 2 Standard drinks or equivalent per week  . Drug use: No     Allergies   Patient has no known allergies.   Review of Systems Review of Systems  Constitutional: Negative for chills and fever.  Respiratory: Negative for cough, chest tightness and shortness of breath.   Cardiovascular: Negative for chest pain, palpitations and leg swelling.  Gastrointestinal: Negative for abdominal distention, abdominal pain, diarrhea, nausea and vomiting.  Genitourinary: Negative for dysuria, frequency, hematuria and urgency.  Musculoskeletal: Negative for arthralgias, myalgias, neck pain and neck stiffness.  Skin: Negative for rash.  Allergic/Immunologic: Negative for immunocompromised state.  Neurological: Positive for headaches. Negative for dizziness, weakness, light-headedness and numbness.  Physical Exam Updated Vital Signs BP (!) 160/83 (BP Location: Right Arm)   Pulse 73   Temp 98.4 F (36.9 C) (Oral)   Resp 17   SpO2 95%   Physical Exam  Constitutional: He is oriented to person, place, and time. He appears well-developed and well-nourished. No distress.  HENT:  Head: Normocephalic and atraumatic.  Eyes: Pupils are equal, round, and reactive to light. Conjunctivae and EOM are normal.  Neck: Neck supple.  Cardiovascular: Normal rate, regular rhythm and normal heart sounds.  Pulmonary/Chest: Effort  normal. No respiratory distress. He has no wheezes. He has no rales.  Abdominal: Soft. Bowel sounds are normal. He exhibits no distension. There is no tenderness. There is no rebound.  Musculoskeletal: He exhibits no edema.  Neurological: He is alert and oriented to person, place, and time. He has normal strength. He displays normal reflexes. No cranial nerve deficit or sensory deficit. GCS eye subscore is 4. GCS verbal subscore is 5. GCS motor subscore is 6.  Skin: Skin is warm and dry.  Psychiatric: He has a normal mood and affect.  Nursing note and vitals reviewed.    ED Treatments / Results  Labs (all labs ordered are listed, but only abnormal results are displayed) Labs Reviewed  COMPREHENSIVE METABOLIC PANEL - Abnormal; Notable for the following components:      Result Value   Glucose, Bld 117 (*)    All other components within normal limits  CBC WITH DIFFERENTIAL/PLATELET    EKG None  Radiology Ct Head Wo Contrast  Result Date: 07/16/2018 CLINICAL DATA:  Headache. EXAM: CT HEAD WITHOUT CONTRAST TECHNIQUE: Contiguous axial images were obtained from the base of the skull through the vertex without intravenous contrast. COMPARISON:  None. FINDINGS: Brain: No evidence of acute infarction, hemorrhage, hydrocephalus, extra-axial collection or mass lesion/mass effect. Vascular: No hyperdense vessel or unexpected calcification. Skull: Normal. Negative for fracture or focal lesion. Sinuses/Orbits: No acute finding. Other: None. IMPRESSION: No acute intracranial abnormality. Electronically Signed   By: Ted Mcalpine M.D.   On: 07/16/2018 07:27    Procedures Procedures (including critical care time)  Medications Ordered in ED Medications  sodium chloride 0.9 % bolus 1,000 mL (1,000 mLs Intravenous New Bag/Given 07/16/18 0715)  prochlorperazine (COMPAZINE) injection 10 mg (has no administration in time range)  diphenhydrAMINE (BENADRYL) injection 25 mg (has no administration in  time range)     Initial Impression / Assessment and Plan / ED Course  I have reviewed the triage vital signs and the nursing notes.  Pertinent labs & imaging results that were available during my care of the patient were reviewed by me and considered in my medical decision making (see chart for details).     Patient with intermittent headache that has become more severe over the last several days.  Woke him up from sleep last night.  Patient appears to be really uncomfortable has normal neurological exam.  No associated symptoms.  No photophobia, no nausea or vomiting.  No dizziness.  Will check basic labs, CT head, will give fluids and pain medications.  8:52 AM Pt's headache not much improved after toradol, decadron, compazine, benadryl. Labs and CT neagitve. No concern for intracranial bleed. No masses or lesions seen on CT. Differential includes tention hedache, migraine, sleep apnea, cluster headache. Will try magnesium. Pt reassured that his ct and labs normal.   Pt feels better. Home with fioricet. Follow up with pcp. Return as needed.   Vitals:   07/16/18 8295 07/16/18 6213  07/16/18 0839 07/16/18 0900  BP: (!) 152/103 (!) 160/83 137/90 (!) 139/93  Pulse: 73  66 69  Resp: 16 17 18 18   Temp: 98.4 F (36.9 C)     TempSrc: Oral     SpO2: 97% 95% 94% 92%      Final Clinical Impressions(s) / ED Diagnoses   Final diagnoses:  Bad headache    ED Discharge Orders         Ordered    butalbital-acetaminophen-caffeine (FIORICET, ESGIC) 50-325-40 MG tablet  Every 6 hours PRN     07/16/18 0944           Jaynie CrumbleKirichenko, Alberta Cairns, PA-C 07/16/18 1527    Lorre NickAllen, Anthony, MD 07/18/18 [phone]

## 2018-07-16 NOTE — ED Notes (Signed)
Pt treated several days ago with headache medication (injection) and nasal spray, no improvement. Pt c/o headache, worsening over months.

## 2018-07-16 NOTE — Discharge Instructions (Addendum)
Take ibuprofen for headache. Take Fioricet if not improved with ibuprofen. Please follow up with family doctor for further evaluation. Return as needed.

## 2018-07-16 NOTE — ED Triage Notes (Signed)
Pt reports headache in his L temple that is radiating across his forehead. He states that it has been mostly constant for the last 2-3 weeks. [facility] and states it eased up for a little while, but has returned. He states that it woke him up today. Denies vision changes or photophobia. No unilateral deficits noted.

## 2018-09-20 ENCOUNTER — Other Ambulatory Visit: Payer: Self-pay | Admitting: Internal Medicine

## 2018-09-20 DIAGNOSIS — R51 Headache: Secondary | ICD-10-CM | POA: Diagnosis not present

## 2018-09-20 DIAGNOSIS — I712 Thoracic aortic aneurysm, without rupture, unspecified: Secondary | ICD-10-CM

## 2018-09-20 DIAGNOSIS — M549 Dorsalgia, unspecified: Secondary | ICD-10-CM | POA: Diagnosis not present

## 2018-09-20 DIAGNOSIS — Z1389 Encounter for screening for other disorder: Secondary | ICD-10-CM | POA: Diagnosis not present

## 2018-09-27 ENCOUNTER — Inpatient Hospital Stay: Admission: RE | Admit: 2018-09-27 | Payer: Self-pay | Source: Ambulatory Visit

## 2018-10-04 DIAGNOSIS — B081 Molluscum contagiosum: Secondary | ICD-10-CM | POA: Diagnosis not present

## 2018-10-04 DIAGNOSIS — L821 Other seborrheic keratosis: Secondary | ICD-10-CM | POA: Diagnosis not present

## 2018-10-12 ENCOUNTER — Inpatient Hospital Stay: Admission: RE | Admit: 2018-10-12 | Payer: Self-pay | Source: Ambulatory Visit

## 2018-10-12 DIAGNOSIS — Z0000 Encounter for general adult medical examination without abnormal findings: Secondary | ICD-10-CM | POA: Diagnosis not present

## 2018-10-12 DIAGNOSIS — Z13220 Encounter for screening for lipoid disorders: Secondary | ICD-10-CM | POA: Diagnosis not present

## 2018-10-21 ENCOUNTER — Other Ambulatory Visit: Payer: Self-pay | Admitting: *Deleted

## 2018-10-21 NOTE — Progress Notes (Unsigned)
ct 

## 2019-02-04 ENCOUNTER — Emergency Department (HOSPITAL_COMMUNITY): Payer: 59

## 2019-02-04 ENCOUNTER — Other Ambulatory Visit: Payer: Self-pay

## 2019-02-04 ENCOUNTER — Emergency Department (HOSPITAL_COMMUNITY)
Admission: EM | Admit: 2019-02-04 | Discharge: 2019-02-04 | Disposition: A | Discharge status: Home / Self Care | Payer: 59 | Attending: Emergency Medicine | Admitting: Emergency Medicine

## 2019-02-04 ENCOUNTER — Encounter (HOSPITAL_COMMUNITY): Payer: Self-pay | Admitting: *Deleted

## 2019-02-04 DIAGNOSIS — Z7982 Long term (current) use of aspirin: Secondary | ICD-10-CM | POA: Insufficient documentation

## 2019-02-04 DIAGNOSIS — Z79899 Other long term (current) drug therapy: Secondary | ICD-10-CM | POA: Diagnosis not present

## 2019-02-04 DIAGNOSIS — Z87891 Personal history of nicotine dependence: Secondary | ICD-10-CM | POA: Diagnosis not present

## 2019-02-04 DIAGNOSIS — R0789 Other chest pain: Secondary | ICD-10-CM | POA: Insufficient documentation

## 2019-02-04 HISTORY — DX: Coronary artery aneurysm: I25.41

## 2019-02-04 LAB — CBC WITH DIFFERENTIAL/PLATELET
Abs Immature Granulocytes: 0.02 10*3/uL (ref 0.00–0.07)
Basophils Absolute: 0 10*3/uL (ref 0.0–0.1)
Basophils Relative: 0 %
Eosinophils Absolute: 0.1 10*3/uL (ref 0.0–0.5)
Eosinophils Relative: 1 %
HCT: 47.4 % (ref 39.0–52.0)
Hemoglobin: 15.9 g/dL (ref 13.0–17.0)
Immature Granulocytes: 0 %
Lymphocytes Relative: 25 %
Lymphs Abs: 1.7 10*3/uL (ref 0.7–4.0)
MCH: 30.2 pg (ref 26.0–34.0)
MCHC: 33.5 g/dL (ref 30.0–36.0)
MCV: 90.1 fL (ref 80.0–100.0)
Monocytes Absolute: 0.6 10*3/uL (ref 0.1–1.0)
Monocytes Relative: 9 %
Neutro Abs: 4.2 10*3/uL (ref 1.7–7.7)
Neutrophils Relative %: 65 %
Platelets: 221 10*3/uL (ref 150–400)
RBC: 5.26 MIL/uL (ref 4.22–5.81)
RDW: 13.5 % (ref 11.5–15.5)
WBC: 6.6 10*3/uL (ref 4.0–10.5)
nRBC: 0 % (ref 0.0–0.2)

## 2019-02-04 LAB — COMPREHENSIVE METABOLIC PANEL
ALT: 55 U/L — ABNORMAL HIGH (ref 0–44)
AST: 76 U/L — ABNORMAL HIGH (ref 15–41)
Albumin: 3.9 g/dL (ref 3.5–5.0)
Alkaline Phosphatase: 61 U/L (ref 38–126)
Anion gap: 12 (ref 5–15)
BUN: 5 mg/dL — ABNORMAL LOW (ref 6–20)
CO2: 27 mmol/L (ref 22–32)
Calcium: 9.3 mg/dL (ref 8.9–10.3)
Chloride: 102 mmol/L (ref 98–111)
Creatinine, Ser: 0.95 mg/dL (ref 0.61–1.24)
GFR calc Af Amer: >60 mL/min (ref >60)
GFR calc non Af Amer: >60 mL/min (ref >60)
Glucose, Bld: 110 mg/dL — ABNORMAL HIGH (ref 70–99)
Potassium: 3.5 mmol/L (ref 3.5–5.1)
Sodium: 141 mmol/L (ref 135–145)
Total Bilirubin: 1.3 mg/dL — ABNORMAL HIGH (ref 0.3–1.2)
Total Protein: 7.3 g/dL (ref 6.5–8.1)

## 2019-02-04 LAB — TROPONIN I: Troponin I: <0.03 ng/mL (ref <0.03)

## 2019-02-04 MED ORDER — PANTOPRAZOLE SODIUM 40 MG IV SOLR
40.0000 mg | Freq: Once | INTRAVENOUS | Status: AC
  Administered 2019-02-04: 40 mg via INTRAVENOUS
  Filled 2019-02-04: qty 40

## 2019-02-04 MED ORDER — ACETAMINOPHEN 325 MG PO TABS
650.0000 mg | ORAL_TABLET | Freq: Once | ORAL | Status: AC
  Administered 2019-02-04: 650 mg via ORAL
  Filled 2019-02-04: qty 2

## 2019-02-04 MED ORDER — IOHEXOL 350 MG/ML SOLN
100.0000 mL | Freq: Once | INTRAVENOUS | Status: AC | PRN
  Administered 2019-02-04: 100 mL via INTRAVENOUS

## 2019-02-04 MED ORDER — LIDOCAINE VISCOUS HCL 2 % MT SOLN
15.0000 mL | Freq: Once | OROMUCOSAL | Status: AC
  Administered 2019-02-04: 15 mL via OROMUCOSAL
  Filled 2019-02-04: qty 15

## 2019-02-04 MED ORDER — OMEPRAZOLE 20 MG PO CPDR: 20.0000 mg | DELAYED_RELEASE_CAPSULE | Freq: Every day | ORAL | 0 refills | Status: AC

## 2019-02-04 MED ORDER — ALUM & MAG HYDROXIDE-SIMETH 200-200-20 MG/5ML PO SUSP
15.0000 mL | Freq: Once | ORAL | Status: AC
  Administered 2019-02-04: 13:00:00 15 mL via ORAL
  Filled 2019-02-04: qty 30

## 2019-02-04 NOTE — ED Triage Notes (Signed)
Pt in with c/o central chest pressure since last night. States somewhat sob with exertion, denies n/v. Hx of thoracic aortic aneurysm since 17'. Denies any pain radiation to back

## 2019-02-04 NOTE — ED Triage Notes (Signed)
PT reports  Lt sided chest pressure that  Started last night.

## 2019-02-04 NOTE — ED Provider Notes (Signed)
[facility] EMERGENCY DEPARTMENT Provider Note   CSN: [id number] Arrival date & time: 02/04/19  0741    History   Chief Complaint Chief Complaint  Patient presents with   Chest Pain    HPI Daniel [name] is a 48 y.o. male with past medical history of GERD, ascending aortic aneurysm, presenting to the emergency department with complaint of mild left-sided and central chest pain/pressure that began yesterday evening after eating dinner.  He states he ate steak, mashed potatoes and green beans and then laid down.  He states he began having symptoms which feel very similar to his ongoing problems with GERD.  Treated with Pepcid AC without relief.  He states symptoms worsen somewhat throughout the evening, and were worse with eating and drinking.  He states symptoms are constant overnight and into today, however his symptoms are little bit better on evaluation.  He has no shortness of breath, nausea, vomiting, diaphoresis, or radiation of pain.  No history of DVT/PE, he was a smoker years ago, no recent surgery, no leg pain or swelling. 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  He has not had any follow-up imaging since that time. No history of cardiac stress test.  No known cardiac history.  Denies history of hyperlipidemia, hypertension, or diabetes, however does not appear patient has been followed by PCP in few years.     The history is provided by the patient and medical records.    Past Medical History:  Diagnosis Date   Allergy    Aneurysm (arteriovenous) of coronary vessels    GERD (gastroesophageal reflux disease)     There are no active problems to display for this patient.   Past Surgical History:  Procedure Laterality Date   Elbow Left    KNEE SURGERY Right         Home Medications    Prior to Admission medications   Medication Sig Start Date End Date Taking? Authorizing  Provider  aspirin EC 81 MG tablet Take 81 mg by mouth every evening.    Yes [provider]  Lansoprazole (PREVACID PO) Take 1 capsule by mouth daily.   Yes [provider]  Multiple Vitamin (MULTIVITAMIN WITH MINERALS) TABS Take 1 tablet by mouth every evening.   Yes [provider]  vitamin B-12 (CYANOCOBALAMIN) 100 MCG tablet Take 100 mcg by mouth daily.   Yes [provider]  omeprazole (PRILOSEC) 20 MG capsule Take 1 capsule (20 mg total) by mouth daily. 02/04/19   Casy Brunetto, [place] N, PA-C  predniSONE (DELTASONE) 50 MG tablet Take 1 pill daily for 5 days. Patient not taking: Reported on 07/16/2018 08/19/16   Melony Overly, MD    Family History Family History  Problem Relation Age of Onset   Diabetes Mother     Social History Social History   Tobacco Use   Smoking status: Former Smoker    Packs/day: 0.50   Smokeless tobacco: Never Used   Tobacco comment: EVERY THREE DAYS FOR 15 YRS  Substance Use Topics   Alcohol use: Yes    Alcohol/week: 2.0 standard drinks    Types: 2 Standard drinks or equivalent per week   Drug use: No     Allergies   Patient has no known allergies.   Review of Systems Review of Systems  Constitutional: Negative for diaphoresis and fever.  HENT: Negative for congestion.   Respiratory: Negative for cough and shortness  of breath.   Cardiovascular: Positive for chest pain. Negative for palpitations and leg swelling.  Gastrointestinal: Negative for abdominal pain, nausea and vomiting.  Musculoskeletal: Negative for back pain.  All other systems reviewed and are negative.    Physical Exam Updated Vital Signs BP 131/87    Pulse (!) 107    Temp 98.2 F (36.8 C)    Resp 15    Ht 6\' 3"  (1.905 m)    Wt 127 kg    SpO2 97%    BMI 35.00 kg/m   Physical Exam Vitals signs and nursing note reviewed.  Constitutional:      General: He is not in acute distress.    Appearance: He is well-developed. He is obese.    HENT:     Head: Normocephalic and atraumatic.  Eyes:     Conjunctiva/sclera: Conjunctivae normal.  Neck:     Musculoskeletal: Normal range of motion and neck supple. No muscular tenderness.  Cardiovascular:     Rate and Rhythm: Regular rhythm. Tachycardia present.  Pulmonary:     Effort: Pulmonary effort is normal. No respiratory distress.     Breath sounds: Normal breath sounds.  Abdominal:     General: Bowel sounds are normal.     Palpations: Abdomen is soft.     Tenderness: There is no abdominal tenderness. There is no guarding or rebound.  Musculoskeletal:     Right lower leg: No edema.     Left lower leg: No edema.  Skin:    General: Skin is warm.  Neurological:     Mental Status: He is alert.  Psychiatric:        Behavior: Behavior normal.      ED Treatments / Results  Labs (all labs ordered are listed, but only abnormal results are displayed) Labs Reviewed  COMPREHENSIVE METABOLIC PANEL - Abnormal; Notable for the following components:      Result Value   Glucose, Bld 110 (*)    BUN 5 (*)    AST 76 (*)    ALT 55 (*)    Total Bilirubin 1.3 (*)    All other components within normal limits  CBC WITH DIFFERENTIAL/PLATELET  TROPONIN I    EKG EKG Interpretation  Date/Time:  Saturday February 04 2019 07:50:06 EDT Ventricular Rate:  119 PR Interval:    QRS Duration: 84 QT Interval:  312 QTC Calculation: 439 R Axis:   73 Text Interpretation:  Sinus tachycardia LAE, consider biatrial enlargement tachycardia otherwise similar to previous Confirmed by Arby BarrettePfeiffer, Marcy [phone]([id number]) on 02/04/2019 8:41:05 AM   Radiology Dg Chest 2 View  Result Date: 02/04/2019 CLINICAL DATA:  Chest pain EXAM: CHEST - 2 VIEW COMPARISON:  05/06/2016 chest radiograph. FINDINGS: Stable cardiomediastinal silhouette with normal heart size. No pneumothorax. No pleural effusion. Lungs appear clear, with no acute consolidative airspace disease and no pulmonary edema. IMPRESSION: No active  cardiopulmonary disease. Electronically Signed   By: Delbert PhenixJason A Poff M.D.   On: 02/04/2019 08:49   Ct Angio Chest/abd/pel For Dissection W And/or W/wo  Result Date: 02/04/2019 CLINICAL DATA:  Left chest pain since last night. EXAM: CT ANGIOGRAPHY CHEST, ABDOMEN AND PELVIS TECHNIQUE: Multidetector CT imaging through the chest, abdomen and pelvis was performed using the standard protocol during bolus administration of intravenous contrast. Multiplanar reconstructed images and MIPs were obtained and reviewed to evaluate the vascular anatomy. CONTRAST:  100mL OMNIPAQUE IOHEXOL 350 MG/ML SOLN COMPARISON:  Chest radiograph from earlier today. 05/06/2016 chest CT angiogram. FINDINGS: CTA CHEST FINDINGS Cardiovascular:  Normal heart size. No significant pericardial effusion/thickening. No acute intramural hematoma in the thoracic aorta. Ascending thoracic aorta is ectatic with maximum diameter 4.1 cm. No acute thoracic aortic dissection, pseudoaneurysm or penetrating atherosclerotic ulcer. Aortic arch branch vessels are patent. Top-normal caliber main pulmonary artery (3.4 cm diameter). No central pulmonary emboli. Mediastinum/Nodes: No discrete thyroid nodules. Unremarkable esophagus. No pathologically enlarged axillary, mediastinal or hilar lymph nodes. Lungs/Pleura: No pneumothorax. No pleural effusion. No acute consolidative airspace disease, lung masses or significant pulmonary nodules. Musculoskeletal: No aggressive appearing focal osseous lesions. Mild-to-moderate thoracic spondylosis. Review of the MIP images confirms the above findings. CTA ABDOMEN AND PELVIS FINDINGS VASCULAR Aorta: Normal caliber aorta without aneurysm, dissection, vasculitis or significant stenosis. Celiac: Patent without evidence of aneurysm, dissection, vasculitis or significant stenosis. SMA: Patent without evidence of aneurysm, dissection, vasculitis or significant stenosis. Renals: Both renal arteries are patent without evidence of  aneurysm, dissection, vasculitis, fibromuscular dysplasia or significant stenosis. IMA: Patent without evidence of aneurysm, dissection, vasculitis or significant stenosis. Inflow: Patent without evidence of aneurysm, dissection, vasculitis or significant stenosis. Veins: No obvious venous abnormality within the limitations of this arterial phase study. Review of the MIP images confirms the above findings. NON-VASCULAR Hepatobiliary: Normal liver with no liver mass. Normal gallbladder with no radiopaque cholelithiasis. No biliary ductal dilatation. Pancreas: Normal, with no mass or duct dilation. Spleen: Normal size. No mass. Adrenals/Urinary Tract: Normal adrenals. Normal kidneys with no hydronephrosis and no renal mass. Normal bladder. Stomach/Bowel: Normal non-distended stomach. Normal caliber small bowel with no small bowel wall thickening. Normal appendix. Normal large bowel with no diverticulosis, large bowel wall thickening or pericolonic fat stranding. Vascular/Lymphatic: Normal caliber abdominal aorta. No pathologically enlarged lymph nodes in the abdomen or pelvis. Reproductive: Normal size prostate. Other: No pneumoperitoneum, ascites or focal fluid collection. Tiny fat containing umbilical hernia Musculoskeletal: No aggressive appearing focal osseous lesions. Mild degenerative disc disease in the lower lumbar spine. Review of the MIP images confirms the above findings. IMPRESSION: 1. No acute aortic syndrome. 2. Ectatic 4.1 cm ascending thoracic aorta. Recommend annual imaging followup by CTA or MRA. This recommendation follows 2010 ACCF/AHA/AATS/ACR/ASA/SCA/SCAI/SIR/STS/SVM Guidelines for the Diagnosis and Management of Patients with Thoracic Aortic Disease. Circulation. 2010; 121: M841-L244: E266-e369. Aortic aneurysm NOS (ICD10-I71.9). 3. No active pulmonary disease. 4. No acute abnormality in the abdomen or pelvis. Electronically Signed   By: Delbert PhenixJason A Poff M.D.   On: 02/04/2019 11:10    Procedures Procedures  (including critical care time)  Medications Ordered in ED Medications  alum & mag hydroxide-simeth (MAALOX/MYLANTA) 200-200-20 MG/5ML suspension 15 mL (has no administration in time range)  lidocaine (XYLOCAINE) 2 % viscous mouth solution 15 mL (has no administration in time range)  pantoprazole (PROTONIX) injection 40 mg (40 mg Intravenous Given 02/04/19 0923)  iohexol (OMNIPAQUE) 350 MG/ML injection 100 mL (100 mLs Intravenous Contrast Given 02/04/19 0940)  acetaminophen (TYLENOL) tablet 650 mg (650 mg Oral Given 02/04/19 1111)     Initial Impression / Assessment and Plan / ED Course  I have reviewed the triage vital signs and the nursing notes.  Pertinent labs & imaging results that were available during my care of the patient were reviewed by me and considered in my medical decision making (see chart for details).        Patient with history of GERD, presenting the emergency department with atypical chest pain has been constant since last night.  Reports symptoms feel very similar to GERD symptoms, however was not improved with his Pepcid AC.  He states symptoms are worse after meals.  Low heart score.  On exam, patient is slightly tachycardic, however heart and lung sounds are normal.  EKG is nonischemic.  Chest x-ray is negative.  Troponin is negative.  Patient has history of a sending aortic aneurysm, has not been followed up on since initial diagnosis 3 years ago.  CTA of the chest was ordered and shows stable appearing aortic aneurysm, no other acute findings are present.  Patient treated in the ED with IV Protonix and tylenol.  On reevaluation patient is eating [employer] in a reclined position.  His symptoms are now much improved.  Will give GI cocktail, however strongly encourage dietary modifications and sitting upright during and after meals to help with GERD symptoms.  Instructed close PCP follow-up and strict return precautions.  Patient is agreeable to plan and safe for  discharge.  Discussed results, findings, treatment and follow up. Patient advised of return precautions. Patient verbalized understanding and agreed with plan.  Final Clinical Impressions(s) / ED Diagnoses   Final diagnoses:  Atypical chest pain    ED Discharge Orders         Ordered    omeprazole (PRILOSEC) 20 MG capsule  Daily     02/04/19 1219           Marcellina Jonsson, [place] N, [place] JerseyPA-C 02/04/19 1223    Arby BarrettePfeiffer, Marcy, MD 02/04/19 1326

## 2019-02-04 NOTE — ED Provider Notes (Signed)
Medical screening examination/treatment/procedure(s) were conducted as a shared visit with non-physician practitioner(s) and myself.  I personally evaluated the patient during the encounter.  EKG Interpretation  Date/Time:  Saturday February 04 2019 07:50:06 EDT Ventricular Rate:  119 PR Interval:    QRS Duration: 84 QT Interval:  312 QTC Calculation: 439 R Axis:   73 Text Interpretation:  Sinus tachycardia LAE, consider biatrial enlargement tachycardia otherwise similar to previous Confirmed by Charlesetta Shanks [phone]) on 02/04/2019 8:41:05 AM  Patient reports he is been having some pressure and burning discomfort in his epigastric area.  There was he is concerned because last time he has something similar he was diagnosed with a dissection and he was supposed without follow-up on it but did not for about 3 years.  Is alert and appropriate.  No respiratory distress.  Mental status clear.  Heart regular no gross murmur gallop.  Lungs clear.  Abdomen soft with mild epigastric discomfort.  I agree with plan of management.   Charlesetta Shanks, MD 02/04/19 1326

## 2019-02-04 NOTE — Discharge Instructions (Signed)
Please read instructions below. Follow up with your primary care provider regarding your visit today. Avoid fatty/greasy meals, as well as spicy foods, as this can worsen your symptoms of acid reflux. Take the omeprazole as prescribed. Stay sitting upright for at least 45 minutes to 1 hour after your meals to help with symptoms. Return to the ER for new or worsening symptoms; including worsening chest pain, shortness of breath, pain that radiates to the arm or neck, pain or shortness of breath worsened with exertion.

## 2019-02-04 NOTE — ED Notes (Signed)
Patient transported to CT 

## 2019-03-14 ENCOUNTER — Ambulatory Visit: Payer: 59 | Admitting: Thoracic Surgery (Cardiothoracic Vascular Surgery)

## 2019-03-16 ENCOUNTER — Telehealth: Payer: 59 | Admitting: Thoracic Surgery (Cardiothoracic Vascular Surgery)

## 2019-03-20 ENCOUNTER — Telehealth: Payer: 59 | Admitting: Thoracic Surgery (Cardiothoracic Vascular Surgery)

## 2019-03-21 ENCOUNTER — Telehealth (INDEPENDENT_AMBULATORY_CARE_PROVIDER_SITE_OTHER): Payer: 59 | Admitting: Thoracic Surgery (Cardiothoracic Vascular Surgery)

## 2019-03-21 ENCOUNTER — Other Ambulatory Visit: Payer: Self-pay

## 2019-03-21 DIAGNOSIS — I7789 Other specified disorders of arteries and arterioles: Secondary | ICD-10-CM

## 2019-03-21 NOTE — Progress Notes (Signed)
      [address]       [place] [address]             [phone]      Daniel [name] requested a telephone visit due to the current COVID-19 outbreak.  He was contacted and positively identified.  Daniel [name] is a 48 year old gentleman with a past medical history significant for gastroesophageal reflux and a borderline ascending aortic aneurysm.  He has no known history of hypertension.  Back in 2017 he presented with chest pain.  A CT showed no evidence of pulmonary embolus or aortic dissection but his ascending aorta was noted to be enlarged at 4.1 cm.  I saw him in the office at that time and recommended a one-year follow-up, but he was lost to follow-up after that.  In June he was in the emergency room with a complaint of left-sided chest pain.  A CT was performed which showed no change in the enlarged ascending aorta.  No other significant pathology was noted.  He was discharged home.  He has not had any recurrent pain.  He has access to a blood pressure cuff but has not been checking it on a regular basis.  Past Medical History:  Diagnosis Date  . Allergy   . Aneurysm (arteriovenous) of coronary vessels   . GERD (gastroesophageal reflux disease)    Physical exam  He checked his blood pressure on his home cuff -Home blood pressure 132/79 No other exam possible due to telephone visit.  Impression  48 year old man with a 4.1 cm ascending aortic ectasia versus aneurysm.  He is a large man, so it is borderline for a true aneurysm.  However given his size it does need continued follow-up.  I recommended that he come to the office in 1 year with an MR angiogram of the chest.  He has no history of hypertension.  He does have access to a blood pressure cuff and I recommended that he check himself at least a couple times a month.  Ideally blood pressure would be below 433 systolic, but we absolutely needed less than 295 systolic.  If he sees values above 140 on a consistent  basis he will need to contact Dr. Lysle Rubens.  I spent 11 minutes on the telephone with Daniel [name].  Revonda Standard Roxan Hockey, MD [facility] [phone]

## 2020-02-05 ENCOUNTER — Ambulatory Visit
Admission: RE | Admit: 2020-02-05 | Discharge: 2020-02-05 | Disposition: A | Discharge status: Home / Self Care | Payer: Self-pay | Source: Ambulatory Visit | Attending: Internal Medicine | Admitting: Internal Medicine

## 2020-02-05 ENCOUNTER — Other Ambulatory Visit: Payer: Self-pay | Admitting: Internal Medicine

## 2020-02-05 DIAGNOSIS — M545 Low back pain, unspecified: Secondary | ICD-10-CM

## 2020-02-05 DIAGNOSIS — M542 Cervicalgia: Secondary | ICD-10-CM

## 2020-02-29 ENCOUNTER — Other Ambulatory Visit: Payer: Self-pay | Admitting: *Deleted

## 2020-02-29 DIAGNOSIS — I712 Thoracic aortic aneurysm, without rupture, unspecified: Secondary | ICD-10-CM

## 2020-03-26 ENCOUNTER — Encounter: Payer: 59 | Admitting: Thoracic Surgery (Cardiothoracic Vascular Surgery)

## 2020-04-11 ENCOUNTER — Other Ambulatory Visit: Payer: 59

## 2020-04-16 ENCOUNTER — Encounter: Payer: 59 | Admitting: Thoracic Surgery (Cardiothoracic Vascular Surgery)

## 2020-09-24 ENCOUNTER — Ambulatory Visit
Admission: RE | Admit: 2020-09-24 | Discharge: 2020-09-24 | Disposition: A | Discharge status: Home / Self Care | Payer: Worker's Compensation | Source: Ambulatory Visit | Attending: Nurse Practitioner | Admitting: Nurse Practitioner

## 2020-09-24 ENCOUNTER — Other Ambulatory Visit: Payer: Self-pay | Admitting: Nurse Practitioner

## 2020-09-24 ENCOUNTER — Other Ambulatory Visit: Payer: Self-pay

## 2020-09-24 DIAGNOSIS — M25562 Pain in left knee: Secondary | ICD-10-CM

## 2021-06-04 ENCOUNTER — Other Ambulatory Visit: Payer: Self-pay | Admitting: Internal Medicine

## 2021-06-04 ENCOUNTER — Ambulatory Visit
Admission: RE | Admit: 2021-06-04 | Discharge: 2021-06-04 | Disposition: A | Discharge status: Home / Self Care | Payer: 59 | Source: Ambulatory Visit | Attending: Internal Medicine | Admitting: Internal Medicine

## 2021-06-04 DIAGNOSIS — M545 Low back pain, unspecified: Secondary | ICD-10-CM

## 2021-06-04 DIAGNOSIS — R0781 Pleurodynia: Secondary | ICD-10-CM

## 2021-11-08 ENCOUNTER — Other Ambulatory Visit: Payer: Self-pay

## 2021-11-08 ENCOUNTER — Ambulatory Visit (HOSPITAL_COMMUNITY)
Admission: EM | Admit: 2021-11-08 | Discharge: 2021-11-08 | Disposition: A | Discharge status: Home / Self Care | Payer: 59 | Attending: Internal Medicine | Admitting: Internal Medicine

## 2021-11-08 ENCOUNTER — Encounter (HOSPITAL_COMMUNITY): Payer: Self-pay

## 2021-11-08 DIAGNOSIS — Z20822 Contact with and (suspected) exposure to covid-19: Secondary | ICD-10-CM | POA: Diagnosis not present

## 2021-11-08 DIAGNOSIS — B349 Viral infection, unspecified: Secondary | ICD-10-CM | POA: Insufficient documentation

## 2021-11-08 LAB — POC INFLUENZA A AND B ANTIGEN (URGENT CARE ONLY)
INFLUENZA A ANTIGEN, POC: NEGATIVE
INFLUENZA B ANTIGEN, POC: NEGATIVE

## 2021-11-08 NOTE — ED Triage Notes (Signed)
Onset yesterday scratchy throat, fatigue, HA, runny nose, chills and body aches.  ?Emesis x1. Epistaxis x1. Has been taking theraflu w/o relief. No diarrhea. Denies cough. ?

## 2021-11-08 NOTE — ED Provider Notes (Addendum)
?[facility] ? ? ? ?CSN: [id number] ?Arrival date & time: 11/08/21  1044 ? ? ?  ? ?History   ?Chief Complaint ?Chief Complaint  ?Patient presents with  ? Generalized Body Aches  ? ? ?HPI ?Daniel [name] is a 51 y.o. male.  ? ?Patient presents with scratchy throat, generalized body aches, fatigue, headache, runny nose, chills, one episode of nausea with vomiting, 1 episode of bloody nose.  All of the symptoms started yesterday.  Denies any known sick contacts or fevers.  Patient has taken TheraFlu with minimal improvement.  Denies chest pain, shortness of breath, sore throat, ear pain, diarrhea, abdominal pain, cough. Denies blood in emesis. ? ? ? ?Past Medical History:  ?Diagnosis Date  ? Allergy   ? Aneurysm (arteriovenous) of coronary vessels   ? GERD (gastroesophageal reflux disease)   ? ? ?There are no problems to display for this patient. ? ? ?Past Surgical History:  ?Procedure Laterality Date  ? Elbow Left   ? KNEE SURGERY Right   ? ? ? ? ? ?Home Medications   ? ?Prior to Admission medications   ?Medication Sig Start Date End Date Taking? Authorizing Provider  ?aspirin EC 81 MG tablet Take 81 mg by mouth every evening.     [provider]  ?Lansoprazole (PREVACID PO) Take 1 capsule by mouth daily.    [provider]  ?Multiple Vitamin (MULTIVITAMIN WITH MINERALS) TABS Take 1 tablet by mouth every evening.    [provider]  ?omeprazole (PRILOSEC) 20 MG capsule Take 1 capsule (20 mg total) by mouth daily. 02/04/19   Robinson, [place] N, PA-C  ?predniSONE (DELTASONE) 50 MG tablet Take 1 pill daily for 5 days. ?Patient not taking: Reported on 07/16/2018 08/19/16   Charm Rings, MD  ?vitamin B-12 (CYANOCOBALAMIN) 100 MCG tablet Take 100 mcg by mouth daily.    [provider]  ? ? ?Family History ?Family History  ?Problem Relation Age of Onset  ? Diabetes Mother   ? ? ?Social History ?Social History  ? ?Tobacco Use  ? Smoking status: Former  ?  Packs/day: 0.50  ?  Types:  Cigarettes  ? Smokeless tobacco: Never  ? Tobacco comments:  ?  EVERY THREE DAYS FOR 15 YRS  ?Substance Use Topics  ? Alcohol use: Yes  ?  Alcohol/week: 2.0 standard drinks  ?  Types: 2 Standard drinks or equivalent per week  ? Drug use: No  ? ? ? ?Allergies   ?Patient has no known allergies. ? ? ?Review of Systems ?Review of Systems ?Per HPI ? ?Physical Exam ?Triage Vital Signs ?ED Triage Vitals  ?Enc Vitals Group  ?   BP 11/08/21 1216 (!) 139/104  ?   Pulse Rate 11/08/21 1216 (!) 109  ?   Resp 11/08/21 1216 17  ?   Temp 11/08/21 1216 98.2 ?F (36.8 ?C)  ?   Temp Source 11/08/21 1216 Oral  ?   SpO2 11/08/21 1216 97 %  ?   Weight --   ?   Height --   ?   Head Circumference --   ?   Peak Flow --   ?   Pain Score 11/08/21 1215 8  ?   Pain Loc --   ?   Pain Edu? --   ?   Excl. in GC? --   ? ?No data found. ? ?Updated Vital Signs ?BP (!) 139/104 (BP Location: Left Arm)   Pulse (!) 109   Temp 98.2 ?  F (36.8 ?C) (Oral)   Resp 17   SpO2 97%  ? ?Visual Acuity ?Right Eye Distance:   ?Left Eye Distance:   ?Bilateral Distance:   ? ?Right Eye Near:   ?Left Eye Near:    ?Bilateral Near:    ? ?Physical Exam ?Constitutional:   ?   General: He is not in acute distress. ?   Appearance: Normal appearance. He is not toxic-appearing or diaphoretic.  ?HENT:  ?   Head: Normocephalic and atraumatic.  ?   Right Ear: Tympanic membrane and ear canal normal.  ?   Left Ear: Tympanic membrane and ear canal normal.  ?   Nose: Congestion present.  ?   Mouth/Throat:  ?   Mouth: Mucous membranes are moist.  ?   Pharynx: No posterior oropharyngeal erythema.  ?Eyes:  ?   Extraocular Movements: Extraocular movements intact.  ?   Conjunctiva/sclera: Conjunctivae normal.  ?   Pupils: Pupils are equal, round, and reactive to light.  ?Cardiovascular:  ?   Rate and Rhythm: Normal rate and regular rhythm.  ?   Pulses: Normal pulses.  ?   Heart sounds: Normal heart sounds.  ?Pulmonary:  ?   Effort: Pulmonary effort is normal. No respiratory distress.  ?    Breath sounds: Normal breath sounds. No stridor. No wheezing, rhonchi or rales.  ?Abdominal:  ?   General: Abdomen is flat. Bowel sounds are normal.  ?   Palpations: Abdomen is soft.  ?Musculoskeletal:     ?   General: Normal range of motion.  ?   Cervical back: Normal range of motion.  ?Skin: ?   General: Skin is warm and dry.  ?Neurological:  ?   General: No focal deficit present.  ?   Mental Status: He is alert and oriented to person, place, and time. Mental status is at baseline.  ?Psychiatric:     ?   Mood and Affect: Mood normal.     ?   Behavior: Behavior normal.  ? ? ? ?UC Treatments / Results  ?Labs ?(all labs ordered are listed, but only abnormal results are displayed) ?Labs Reviewed  ?SARS CORONAVIRUS 2 (TAT 6-24 HRS)  ?POC INFLUENZA A AND B ANTIGEN (URGENT CARE ONLY)  ? ? ?EKG ? ? ?Radiology ?No results found. ? ?Procedures ?Procedures (including critical care time) ? ?Medications Ordered in UC ?Medications - No data to display ? ?Initial Impression / Assessment and Plan / UC Course  ?I have reviewed the triage vital signs and the nursing notes. ? ?Pertinent labs & imaging results that were available during my care of the patient were reviewed by me and considered in my medical decision making (see chart for details). ? ?  ? ?Patient presents with symptoms likely from a viral upper respiratory infection. Differential includes bacterial pneumonia, sinusitis, allergic rhinitis, COVID-19, flu. Do not suspect underlying cardiopulmonary process. Symptoms seem unlikely related to ACS, CHF or COPD exacerbations, pneumonia, pneumothorax. Patient is nontoxic appearing and not in need of emergent medical intervention.  No suspicion for strep throat.  Rapid flu was negative.  COVID test pending. ? ?Recommended symptom control with over the counter medications.  Discussed supportive care with patient. ? ?Suspect bloody nose was from upper respiratory infection.  No current bleeding at this time. Patient does not  take blood thinners.  ? ?Return if symptoms fail to improve in 1-2 weeks or you develop shortness of breath, chest pain, severe headache. Patient states understanding and is agreeable. ? ?Discharged  with PCP followup.  ?Final Clinical Impressions(s) / UC Diagnoses  ? ?Final diagnoses:  ?Viral illness  ? ? ? ?Discharge Instructions   ? ?  ?Your rapid flu test was negative.  COVID test is pending.  It appears that you have a viral illness that should run its course and self resolve in the next few days with symptomatic treatment.  Follow-up if symptoms persist or worsen. ? ? ? ?ED Prescriptions   ?None ?  ? ?PDMP not reviewed this encounter. ?  ?Gustavus BryantMound, Cadence Haslam E, [place]FNP ?11/08/21 1252 ? ?  ?Gustavus BryantMound, Petra Dumler E, [place]FNP ?11/08/21 1252 ? ?

## 2021-11-08 NOTE — ED Notes (Signed)
Covid swab obtained and labeled and placed in mini lab ?

## 2021-11-08 NOTE — Discharge Instructions (Signed)
Your rapid flu test was negative.  COVID test is pending.  It appears that you have a viral illness that should run its course and self resolve in the next few days with symptomatic treatment.  Follow-up if symptoms persist or worsen. ?

## 2021-11-09 LAB — SARS CORONAVIRUS 2 (TAT 6-24 HRS): SARS Coronavirus 2: NEGATIVE

## 2022-05-03 IMAGING — CR DG LUMBAR SPINE 2-3V
3 series · 3 of 3 positions shown · non-contrast
Comparison: None.

CLINICAL DATA: Midline low back pain.  No known injury.

EXAM:
LUMBAR SPINE - 2-3 VIEW

[t l-spine a.p.]
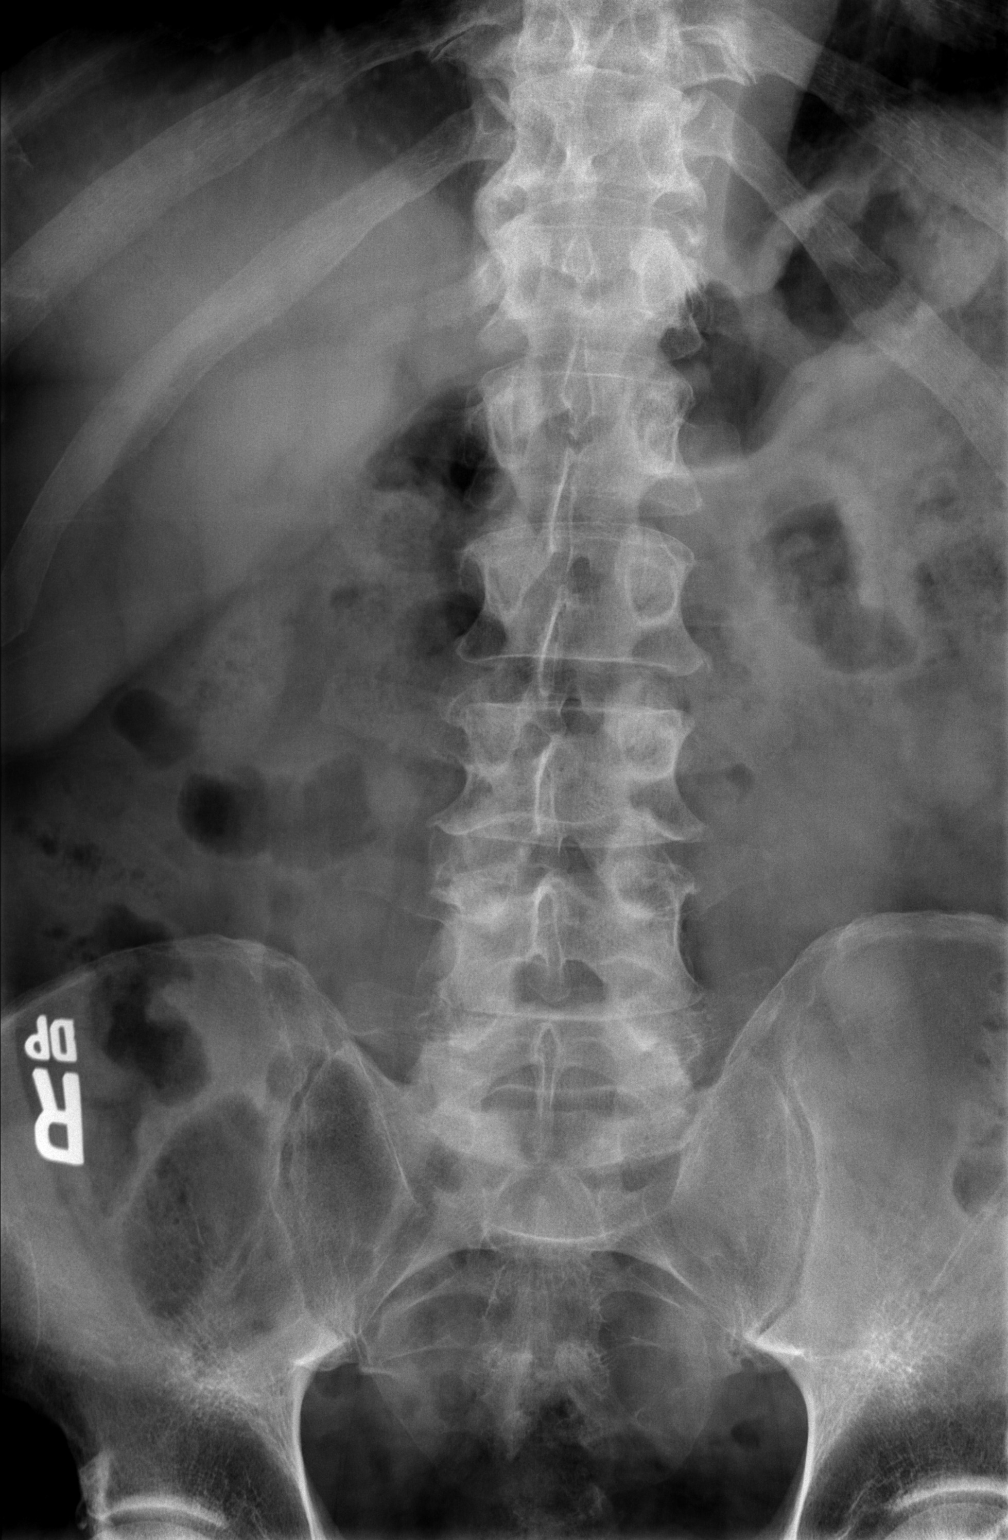

[t l-spine lat]
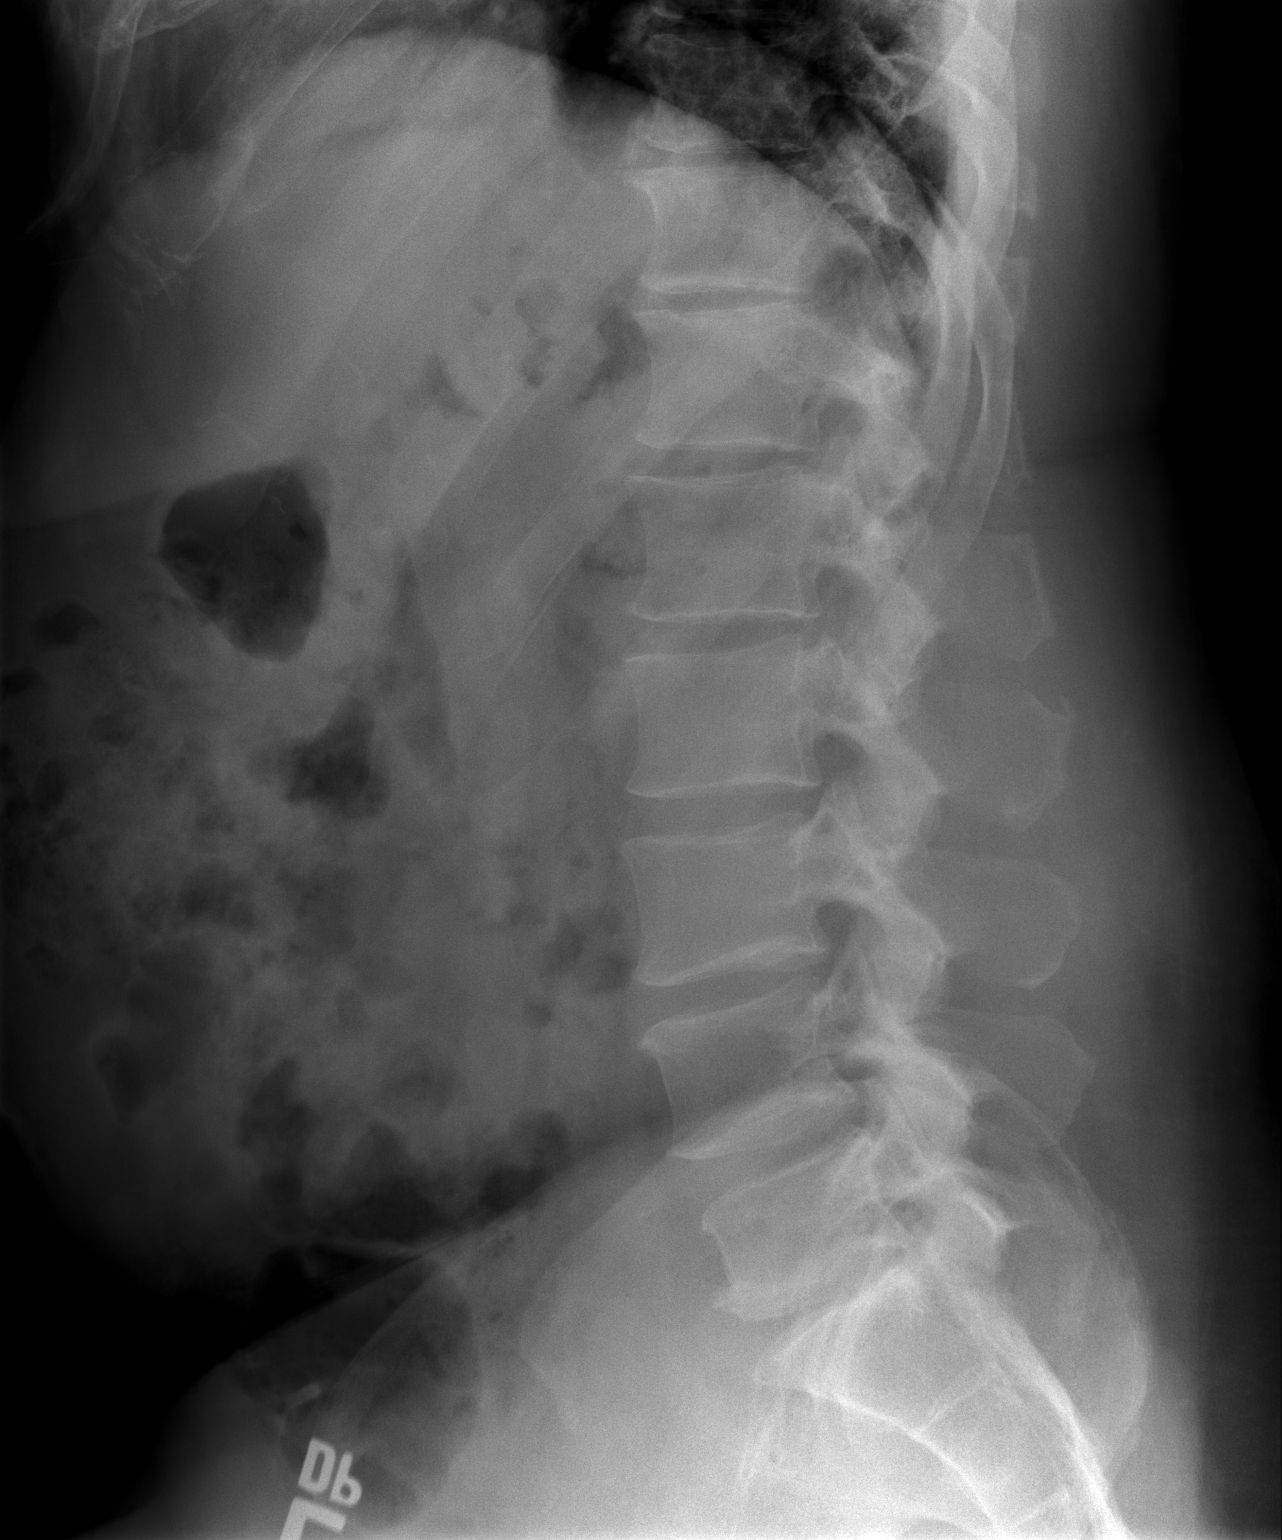

[t l-spine l5-s1 spot]
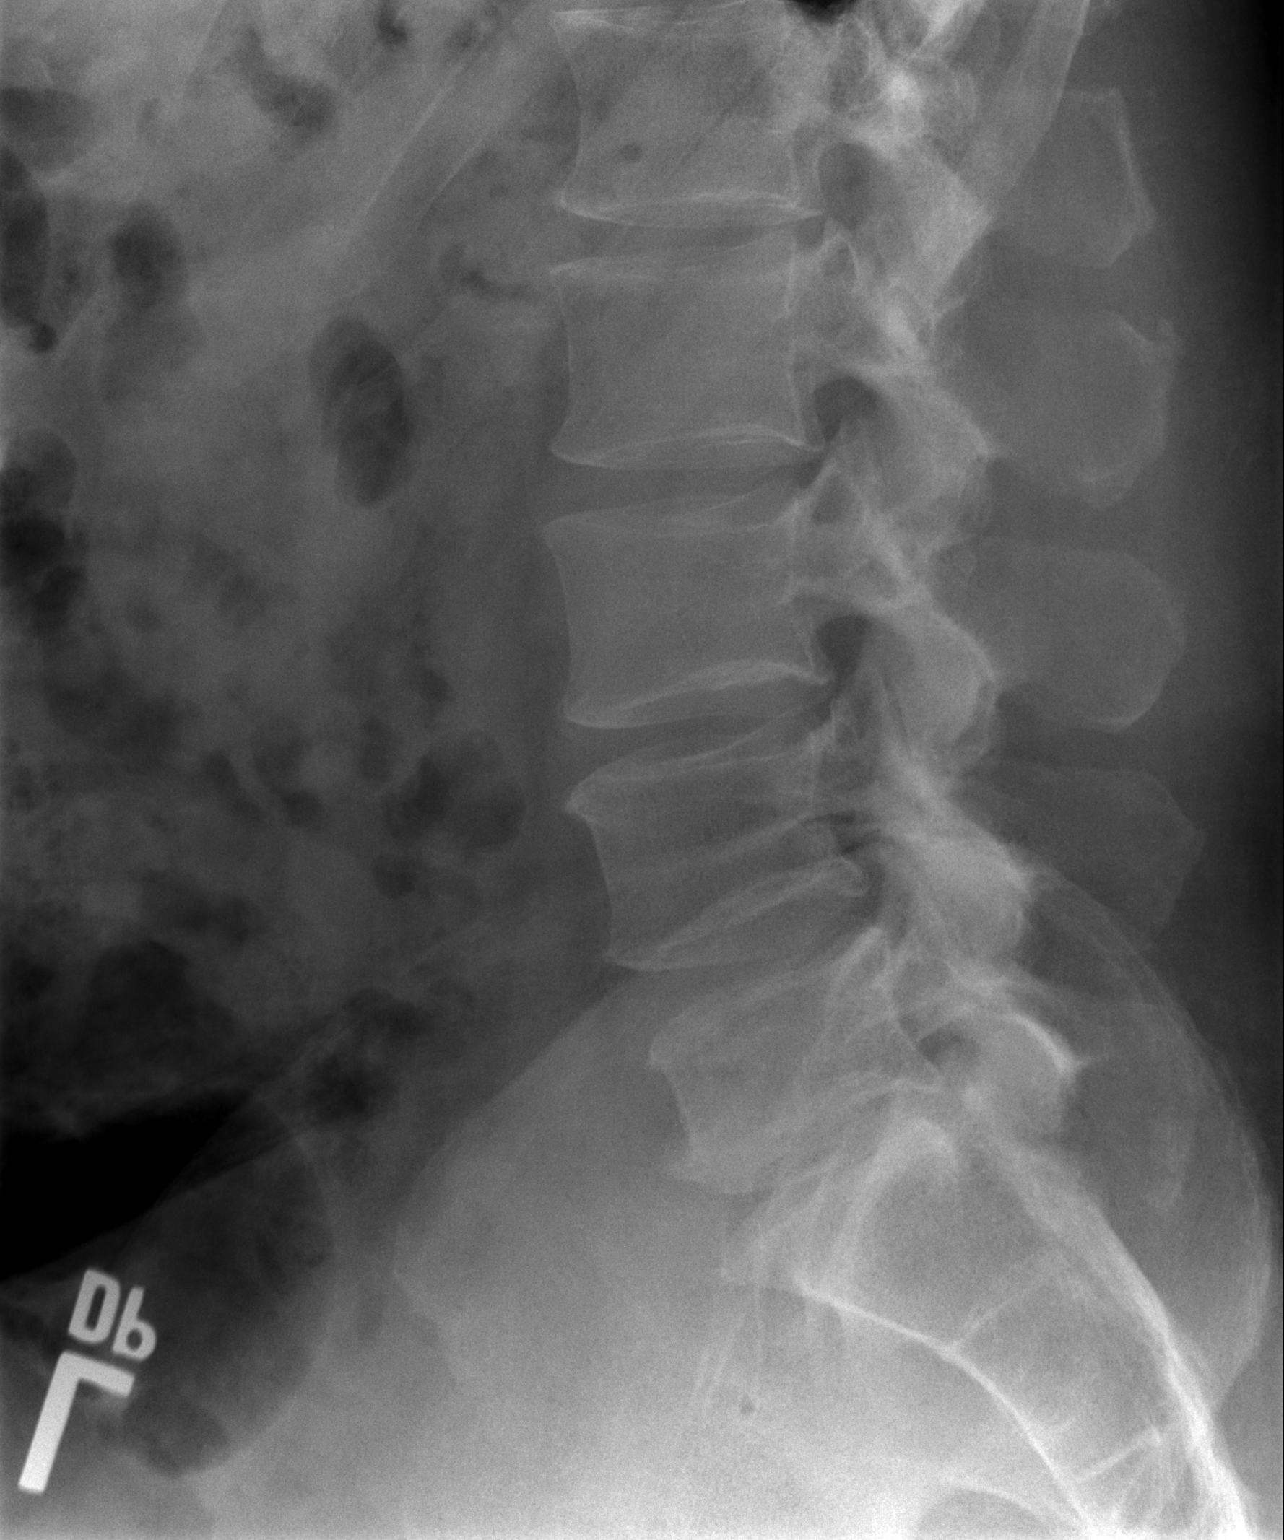

[3 of 3 positions shown; findings below may reference images not displayed]

FINDINGS: There are 5 non rib-bearing lumbar type vertebral bodies.

Mild straightening of the expected lumbar lordosis. No
anterolisthesis or retrolisthesis.

Lumbar vertebral body heights appear preserved.

Mild to moderate multilevel lumbar spine DDD, worse at L5-S1 with
disc space height loss, endplate irregularity and sclerosis.

Limited visualization of the bilateral SI joints is normal.

Regional bowel gas pattern and soft tissues are normal.
IMPRESSION: Mild-to-moderate multilevel lumbar spine DDD, worse at L5-S1.

## 2022-08-26 ENCOUNTER — Ambulatory Visit
Admission: RE | Admit: 2022-08-26 | Discharge: 2022-08-26 | Disposition: A | Discharge status: Home / Self Care | Payer: 59 | Source: Ambulatory Visit | Attending: Internal Medicine | Admitting: Internal Medicine

## 2022-08-26 ENCOUNTER — Other Ambulatory Visit: Payer: Self-pay | Admitting: Internal Medicine

## 2022-08-26 DIAGNOSIS — M25511 Pain in right shoulder: Secondary | ICD-10-CM

## 2022-08-26 DIAGNOSIS — M25562 Pain in left knee: Secondary | ICD-10-CM

## 2022-08-26 DIAGNOSIS — R52 Pain, unspecified: Secondary | ICD-10-CM

## 2022-10-16 DIAGNOSIS — M519 Unspecified thoracic, thoracolumbar and lumbosacral intervertebral disc disorder: Secondary | ICD-10-CM | POA: Insufficient documentation

## 2022-10-16 DIAGNOSIS — K219 Gastro-esophageal reflux disease without esophagitis: Secondary | ICD-10-CM | POA: Insufficient documentation

## 2022-10-16 DIAGNOSIS — I10 Essential (primary) hypertension: Secondary | ICD-10-CM | POA: Insufficient documentation

## 2023-05-24 ENCOUNTER — Ambulatory Visit: Payer: 59 | Admitting: Podiatry

## 2023-05-24 ENCOUNTER — Ambulatory Visit (INDEPENDENT_AMBULATORY_CARE_PROVIDER_SITE_OTHER): Payer: 59

## 2023-05-24 DIAGNOSIS — G5761 Lesion of plantar nerve, right lower limb: Secondary | ICD-10-CM

## 2023-05-24 DIAGNOSIS — M79672 Pain in left foot: Secondary | ICD-10-CM | POA: Diagnosis not present

## 2023-05-24 DIAGNOSIS — D3610 Benign neoplasm of peripheral nerves and autonomic nervous system, unspecified: Secondary | ICD-10-CM | POA: Diagnosis not present

## 2023-05-24 DIAGNOSIS — M778 Other enthesopathies, not elsewhere classified: Secondary | ICD-10-CM

## 2023-05-24 DIAGNOSIS — M722 Plantar fascial fibromatosis: Secondary | ICD-10-CM

## 2023-05-24 MED ORDER — TRIAMCINOLONE ACETONIDE 10 MG/ML IJ SUSP
10.0000 mg | Freq: Once | INTRAMUSCULAR | Status: AC
  Administered 2023-05-24: 10 mg via INTRAMUSCULAR

## 2023-05-24 MED ORDER — MELOXICAM 7.5 MG PO TABS: 7.5000 mg | ORAL_TABLET | Freq: Every day | ORAL | 0 refills | Status: DC

## 2023-05-24 NOTE — Progress Notes (Unsigned)
Chief Complaint  Patient presents with   Flat Foot    Right foot pain across the metatatarsal heads. Pain after sitting and first getting back up.  Also some pain left heel. right is more painful than left. He's tried pedicures, insoles, shoe changes and nothing has helped.     HPI: 52 y.o. male presenting today with c/o pain, burning/tingling, noted near the right 3rd interspace.  Patient describes pain on the ball of the foot when standing.  Feels like "something" is on the bottom of the foot when walking, but nothing is actually there.  Denies injury.   He has small calluses on the ball of the right foot that are painful.  Also has pain in the bottom of the feet / arch areas.    He also instructed his companion, who was present for the appointment, to remove her shoes and have her feet and swollen legs addressed as well.  She was encouraged to make her own appointment for this, but he was insistent she be evaluated today.    Past Medical History:  Diagnosis Date   Allergy    Aneurysm (arteriovenous) of coronary vessels    GERD (gastroesophageal reflux disease)     Past Surgical History:  Procedure Laterality Date   Elbow Left    KNEE SURGERY Right    No Known Allergies   PHYSICAL EXAM:  General: The patient is alert and oriented x3 in no acute distress.  Dermatology: Skin is warm, dry and supple bilateral lower extremities. Interspaces are clear of maceration and debris.  No ecchymosis.  Hyperkeratotic lesions submet 3 and 4 right foot.  Vascular: Palpable pedal pulses bilaterally. Capillary refill within normal limits.  No appreciable edema.  No erythema or calor.  Neurological: Light touch sensation grossly intact bilateral feet.   Musculoskeletal Exam: No pedal deformities noted.  No pain on palpation of the metatarsals.  No pain with ROM of the MPJ's.  There is a + Muldor's sign with compression of the right 3rd interspace.  There is pain with compression of the  right 3rd interspace.  Diffuse pain on palpation plantar arch and anterior to central heels.  No posterior heel pain.  Ankle df less than 10 degrees with knee extended bilateral.   RADIOGRAPHIC EXAM:  Normal osseous mineralization. Long 2nd toe bilateral.  Degenerative joint changes bilateral dorsal midfoot, left worse than right.  No fracture seen.  ASSESSMENT / PLAN OF CARE: 1. Neuroma   2. Capsulitis of foot, right   3. Pain of left heel   4. Bilateral plantar fasciitis     Meds ordered this encounter  Medications   meloxicam (MOBIC) 7.5 MG tablet    Sig: Take 1 tablet (7.5 mg total) by mouth daily.    Dispense:  30 tablet    Refill:  0   triamcinolone acetonide (KENALOG) 10 MG/ML injection 10 mg   Discussed patient's condition today, reviewed xrays,  and possible etiologies.  Discussed conservative treatment options, including off-loading, shoe modifications, cortisone injection, NSAID use, and possible surgery if conservative options fail.  With the patient's verbal consent, a corticosteroid injection was adminstered to the right 3rd interspace.  This consisted of a mixture of 1% lidocaine plain, 0.5% sensorcaine plain, and Kenalog-10 for a total of 1.25cc's administered.  Bandaid applied. Patient tolerated this well.     Calluses were shaved with sterile #313 blade.  Night splint B/L fitted and dispensed.  Wear at bedtime when NWB.  This is a  static AFO with soft interface material.  B/L plantar fascia braces fitted and dispensed.  He'll wear these during waking hours when ambulating.   Return in about 5 weeks (around 06/28/2023) for recheck R neuroma and B/L heel pain.   Clerance Lav, DPM, FACFAS [facility]     [address] [place], [place] [address]                Office [phone]  Fax [phone]

## 2023-06-03 ENCOUNTER — Other Ambulatory Visit: Payer: Self-pay | Admitting: Internal Medicine

## 2023-06-03 DIAGNOSIS — I712 Thoracic aortic aneurysm, without rupture, unspecified: Secondary | ICD-10-CM

## 2023-06-07 ENCOUNTER — Inpatient Hospital Stay: Admission: RE | Admit: 2023-06-07 | Payer: 59 | Source: Ambulatory Visit

## 2023-06-20 ENCOUNTER — Other Ambulatory Visit: Payer: Self-pay | Admitting: Podiatry

## 2023-07-05 ENCOUNTER — Ambulatory Visit: Payer: 59 | Admitting: Podiatry

## 2023-07-07 ENCOUNTER — Encounter (HOSPITAL_COMMUNITY): Payer: Self-pay

## 2023-07-07 ENCOUNTER — Emergency Department (HOSPITAL_COMMUNITY)
Admission: EM | Admit: 2023-07-07 | Discharge: 2023-07-07 | Disposition: A | Discharge status: Home / Self Care | Payer: 59 | Attending: Emergency Medicine | Admitting: Emergency Medicine

## 2023-07-07 ENCOUNTER — Emergency Department (HOSPITAL_COMMUNITY): Payer: 59

## 2023-07-07 DIAGNOSIS — R519 Headache, unspecified: Secondary | ICD-10-CM | POA: Diagnosis present

## 2023-07-07 DIAGNOSIS — Z7982 Long term (current) use of aspirin: Secondary | ICD-10-CM | POA: Insufficient documentation

## 2023-07-07 LAB — BASIC METABOLIC PANEL
Anion gap: 9 (ref 5–15)
BUN: 11 mg/dL (ref 6–20)
CO2: 26 mmol/L (ref 22–32)
Calcium: 8.7 mg/dL — ABNORMAL LOW (ref 8.9–10.3)
Chloride: 101 mmol/L (ref 98–111)
Creatinine, Ser: 1.06 mg/dL (ref 0.61–1.24)
GFR, Estimated: >60 mL/min (ref >60)
Glucose, Bld: 101 mg/dL — ABNORMAL HIGH (ref 70–99)
Potassium: 3.6 mmol/L (ref 3.5–5.1)
Sodium: 136 mmol/L (ref 135–145)

## 2023-07-07 LAB — CBC WITH DIFFERENTIAL/PLATELET
Abs Immature Granulocytes: 0.01 10*3/uL (ref 0.00–0.07)
Basophils Absolute: 0 10*3/uL (ref 0.0–0.1)
Basophils Relative: 1 %
Eosinophils Absolute: 0.1 10*3/uL (ref 0.0–0.5)
Eosinophils Relative: 4 %
HCT: 47.1 % (ref 39.0–52.0)
Hemoglobin: 15.4 g/dL (ref 13.0–17.0)
Immature Granulocytes: 0 %
Lymphocytes Relative: 35 %
Lymphs Abs: 1.1 10*3/uL (ref 0.7–4.0)
MCH: 30.7 pg (ref 26.0–34.0)
MCHC: 32.7 g/dL (ref 30.0–36.0)
MCV: 94 fL (ref 80.0–100.0)
Monocytes Absolute: 0.2 10*3/uL (ref 0.1–1.0)
Monocytes Relative: 6 %
Neutro Abs: 1.7 10*3/uL (ref 1.7–7.7)
Neutrophils Relative %: 54 %
Platelets: 208 10*3/uL (ref 150–400)
RBC: 5.01 MIL/uL (ref 4.22–5.81)
RDW: 13.6 % (ref 11.5–15.5)
WBC: 3.2 10*3/uL — ABNORMAL LOW (ref 4.0–10.5)
nRBC: 0 % (ref 0.0–0.2)

## 2023-07-07 MED ORDER — KETOROLAC TROMETHAMINE 15 MG/ML IJ SOLN
15.0000 mg | Freq: Once | INTRAMUSCULAR | Status: AC
  Administered 2023-07-07: 15 mg via INTRAVENOUS
  Filled 2023-07-07: qty 1

## 2023-07-07 MED ORDER — PROCHLORPERAZINE EDISYLATE 10 MG/2ML IJ SOLN
10.0000 mg | Freq: Once | INTRAMUSCULAR | Status: AC
  Administered 2023-07-07: 10 mg via INTRAVENOUS
  Filled 2023-07-07: qty 2

## 2023-07-07 MED ORDER — CYCLOBENZAPRINE HCL 10 MG PO TABS: 10.0000 mg | ORAL_TABLET | Freq: Two times a day (BID) | ORAL | 0 refills | Status: DC | PRN

## 2023-07-07 MED ORDER — METHOCARBAMOL 500 MG PO TABS
500.0000 mg | ORAL_TABLET | Freq: Once | ORAL | Status: AC
  Administered 2023-07-07: 500 mg via ORAL
  Filled 2023-07-07: qty 1

## 2023-07-07 NOTE — ED Provider Notes (Signed)
[place] EMERGENCY DEPARTMENT AT [facility] Provider Note   CSN: [id number] Arrival date & time: 07/07/23  1120     History  Chief Complaint  Patient presents with   Headache    Daniel [name] is a 52 y.o. male.   Headache Patient presents with headache.  Has had for around 3 days.  Goes from left neck up to head.  Had been seen by PCP yesterday.  Had shot of Toradol and steroids.  Thought to be musculoskeletal.  Goes from left trapezius.  Worse with certain movement of his head.  Also has had headaches in the past.  States usually does not get headaches like this though.  States continued pain.    Past Medical History:  Diagnosis Date   Allergy    Aneurysm (arteriovenous) of coronary vessels    GERD (gastroesophageal reflux disease)     Home Medications Prior to Admission medications   Medication Sig Start Date End Date Taking? Authorizing Provider  aspirin EC 81 MG tablet Take 81 mg by mouth every evening.     [provider]  Lansoprazole (PREVACID PO) Take 1 capsule by mouth daily.    [provider]  meloxicam (MOBIC) 7.5 MG tablet TAKE 1 TABLET BY MOUTH EVERY DAY 06/21/23   McCaughan, Dia D, DPM  Multiple Vitamin (MULTIVITAMIN WITH MINERALS) TABS Take 1 tablet by mouth every evening.    [provider]  omeprazole (PRILOSEC) 20 MG capsule Take 1 capsule (20 mg total) by mouth daily. 02/04/19   Robinson, [place] N, PA-C  predniSONE (DELTASONE) 50 MG tablet Take 1 pill daily for 5 days. Patient not taking: Reported on 07/16/2018 08/19/16   Charm Rings, MD  vitamin B-12 (CYANOCOBALAMIN) 100 MCG tablet Take 100 mcg by mouth daily.    [provider]      Allergies    Patient has no known allergies.    Review of Systems   Review of Systems  Neurological:  Positive for headaches.    Physical Exam Updated Vital Signs BP 129/78 (BP Location: Right Arm)   Pulse 66   Temp 97.8 F (36.6 C) (Oral)   Resp 16   Ht 6'  3" (1.905 m)   Wt (!) 138.3 kg   SpO2 98%   BMI 38.12 kg/m  Physical Exam Vitals reviewed.  HENT:     Head: Normocephalic and atraumatic.  Neck:     Comments:  good range of motion.  Some tenderness over left trapezius and neck pain. Cardiovascular:     Rate and Rhythm: Regular rhythm.  Pulmonary:     Breath sounds: Normal breath sounds.  Musculoskeletal:     Cervical back: Neck supple.  Skin:    General: Skin is warm.  Neurological:     Mental Status: He is alert and oriented to person, place, and time.     ED Results / Procedures / Treatments   Labs (all labs ordered are listed, but only abnormal results are displayed) Labs Reviewed  CBC WITH DIFFERENTIAL/PLATELET - Abnormal; Notable for the following components:      Result Value   WBC 3.2 (*)    All other components within normal limits  BASIC METABOLIC PANEL - Abnormal; Notable for the following components:   Glucose, Bld 101 (*)    Calcium 8.7 (*)    All other components within normal limits    EKG None  Radiology No results found.  Procedures Procedures    Medications Ordered  in ED Medications  ketorolac (TORADOL) 15 MG/ML injection 15 mg (15 mg Intravenous Given 07/07/23 1359)  prochlorperazine (COMPAZINE) injection 10 mg (10 mg Intravenous Given 07/07/23 1359)  methocarbamol (ROBAXIN) tablet 500 mg (500 mg Oral Given 07/07/23 1359)    ED Course/ Medical Decision Making/ A&P                                 Medical Decision Making Amount and/or Complexity of Data Reviewed Labs: ordered. Radiology: ordered.  Risk Prescription drug management.   Patient with headache.  Left-sided.  May come from neck.  No injury however.  Had been treated yesterday with Toradol without much relief.  Will now treat with more of a migraine cocktail since he is complaining more of the headache now.  Will get head CT since different headache for him.  Care turned over to oncoming provider.  Likely able to  discharge with muscle relaxer.        Final Clinical Impression(s) / ED Diagnoses Final diagnoses:  Acute nonintractable headache, unspecified headache type    Rx / DC Orders ED Discharge Orders     None         Benjiman Core, MD 07/07/23 1507

## 2023-07-07 NOTE — ED Provider Notes (Signed)
  Physical Exam    ED Course / MDM   Clinical Course as of 07/07/23 1723  Wed Jul 07, 2023  1522 Received sign out from Dr. Rubin Payor pending CT head. Presenting with headaches. Will re-assess [WS]  1722 CT head is negative.  Reassessed the patient.  He does feel better.  He reports that he had a similar episode of headache after having some shoulder pain which improved after having an injection in the shoulder.  He reports he is seeing his orthopedic doctor tomorrow. Will discharge patient to home. All questions answered. Patient comfortable with plan of discharge. Return precautions discussed with patient and specified on the after visit summary.  [WS]    Clinical Course User Index [WS] Lonell Grandchild, MD   Medical Decision Making Amount and/or Complexity of Data Reviewed Labs: ordered. Radiology: ordered.  Risk Prescription drug management.          Lonell Grandchild, MD 07/07/23 1723

## 2023-07-07 NOTE — Discharge Instructions (Addendum)
Please take Tylenol and Motrin for your symptoms at home.  You can take 1000 mg of Tylenol every 6 hours and 600 mg of ibuprofen every 6 hours as needed for your symptoms.  You can take these medicines together as needed, either at the same time, or alternating every 3 hours.  We have also prescribed you a muscle relaxer. Please take this as prescribed. Be aware this may make you drowsy.   Please follow up with your primary doctor and your orthopedic doctor. Please return if your symptoms change or worsen.

## 2023-07-07 NOTE — ED Triage Notes (Signed)
Pt c/o headache x3 days.  Sts pain is worse in the morning.   Pain score 7/10.  Pt reports taking several medications w/o relief.  Pt reports should issues and think pain may be radiating from shoulders into neck and head.    Pt reports being seen by PCP yesterday for same and was given 2 IM injections.  Pt is unsure what he was given.  Pt also given a prednisone pack, but has not started it.

## 2023-07-18 ENCOUNTER — Other Ambulatory Visit: Payer: Self-pay | Admitting: Podiatry

## 2023-08-16 ENCOUNTER — Other Ambulatory Visit: Payer: Self-pay | Admitting: Podiatry

## 2023-09-19 ENCOUNTER — Other Ambulatory Visit: Payer: Self-pay | Admitting: Podiatry

## 2023-10-11 ENCOUNTER — Other Ambulatory Visit: Payer: Self-pay | Admitting: Internal Medicine

## 2023-10-11 DIAGNOSIS — I712 Thoracic aortic aneurysm, without rupture, unspecified: Secondary | ICD-10-CM

## 2023-10-18 ENCOUNTER — Other Ambulatory Visit: Payer: Self-pay | Admitting: Podiatry

## 2023-10-18 ENCOUNTER — Ambulatory Visit: Payer: 59 | Admitting: Podiatry

## 2023-10-25 ENCOUNTER — Ambulatory Visit: Payer: 59 | Admitting: Podiatry

## 2023-10-25 ENCOUNTER — Encounter: Payer: Self-pay | Admitting: Podiatry

## 2023-10-25 ENCOUNTER — Ambulatory Visit (INDEPENDENT_AMBULATORY_CARE_PROVIDER_SITE_OTHER)

## 2023-10-25 VITALS — Ht 75.0 in | Wt 305.0 lb

## 2023-10-25 DIAGNOSIS — M79671 Pain in right foot: Secondary | ICD-10-CM | POA: Diagnosis not present

## 2023-10-25 DIAGNOSIS — L84 Corns and callosities: Secondary | ICD-10-CM | POA: Diagnosis not present

## 2023-10-25 DIAGNOSIS — M216X1 Other acquired deformities of right foot: Secondary | ICD-10-CM

## 2023-10-25 DIAGNOSIS — L600 Ingrowing nail: Secondary | ICD-10-CM

## 2023-10-25 DIAGNOSIS — M216X2 Other acquired deformities of left foot: Secondary | ICD-10-CM | POA: Diagnosis not present

## 2023-10-25 NOTE — Progress Notes (Signed)
 Chief Complaint  Patient presents with   Foot Pain    " My feet have been hurting for a while and hurt more on the bottom and the right side is worse than the left, I had some cream in the past for a callous but I have not tried anything, I have a spot on my right big toe that I will clip and it will bleed and I have tried to burn it before and it will still come back on the inside of right big toe"   HPI: 53 y.o. male presents today with a couple of concerns.  He notes some discomfort at the ball of both feet.  He did have a neuroma in this area but is not describing the pain in a manner that is consistent with neuroma today.  He does have calluses with a moderate focal lesion on the ball of the right foot.  He notes some discomfort in both heels.  He also notes abnormality to the left great toenail along the lateral nail margin.  States the nail keeps chipping back and will start bleeding along this border.  He would like to get a pedicure soon but he would like to get this resolved as well.  A family member is present for his appointment today.  Past Medical History:  Diagnosis Date   Allergy    Aneurysm (arteriovenous) of coronary vessels    GERD (gastroesophageal reflux disease)     Past Surgical History:  Procedure Laterality Date   Elbow Left    KNEE SURGERY Right    No Known Allergies   Physical Exam: There are palpable pedal pulses.  There are hyperkeratotic lesions right submet 2 and 3 and 5 and minimal on left submet 3.  No hyperkeratoses are noted on the heels.  The right submet 2 lesion has a focal IPK.  There is no surrounding erythema or ulceration noted.  Patient has a flatfoot type bilateral.  No pain on palpation to the plantar aspect of both heels.  No pain on palpation to the plantar medial aspect or posterior aspect of both heels.  The left hallux nail along the lateral border has the superficial portion of the nail missing to the level of the eponychium.  The  deeper nail layer is still present.  There is evidence of dried blood near the eponychium.  There is some discomfort to this area.  There are no clinical signs of infection.  No active drainage is noted.  Radiographic Exam (bilateral foot, 3 weightbearing views, 10/25/2023):  Normal osseous mineralization. Joint spaces preserved.  Long second metatarsal and long second toe bilateral.  There is some medial angulation of the fourth and fifth toes at the DIPJ level.  No fracture is seen  Assessment/Plan of Care: 1. Right foot pain   2. Callus of foot   3. Pronation deformity of right foot   4. Pronation deformity of left foot   5. Ingrown toenail    Discussed clinical and radiographic findings with patient today.  The hyperkeratotic lesions were shaved uneventfully today with a sterile #313 blade.  Discussed a permanent avulsion of the lateral border of the left great toenail at a future visit.  Will get him scheduled within the next 1 to 2 weeks with any provider.  He does typically get pedicures but advised the patient that if he gets a pedicure prior to the nail avulsion to make sure there is no polish on the nail  at the time of his appointment here.  Recommend Revitaderm 40 cream as a daily maintenance cream for his calluses.  Recommended and dispensed power step arch supports for the patient to wear in his sneakers and/or work shoes.   Clerance Lav, DPM, FACFAS [facility]     [address] [place], [place] [address]                Office [phone]  Fax [phone]

## 2023-10-29 ENCOUNTER — Other Ambulatory Visit: Payer: 59

## 2023-11-02 ENCOUNTER — Telehealth: Payer: Self-pay | Admitting: Podiatry

## 2023-11-02 NOTE — Telephone Encounter (Signed)
 Patient would like to know if he is going to be out of work after procedure? If so what is the length of time? Patient contact telephone number [phone]

## 2023-11-03 ENCOUNTER — Ambulatory Visit: Admitting: Podiatry

## 2023-11-03 NOTE — Telephone Encounter (Signed)
 Not my patient but I'm assuming ingrown. Should not be out with this

## 2023-11-08 ENCOUNTER — Encounter: Payer: Self-pay | Admitting: Podiatry

## 2023-11-08 ENCOUNTER — Ambulatory Visit: Admitting: Podiatry

## 2023-11-08 DIAGNOSIS — L600 Ingrowing nail: Secondary | ICD-10-CM

## 2023-11-08 DIAGNOSIS — M722 Plantar fascial fibromatosis: Secondary | ICD-10-CM | POA: Diagnosis not present

## 2023-11-08 MED ORDER — TRIAMCINOLONE ACETONIDE 10 MG/ML IJ SUSP
10.0000 mg | Freq: Once | INTRAMUSCULAR | Status: AC
  Administered 2023-11-08: 10 mg via INTRA_ARTICULAR

## 2023-11-08 NOTE — Patient Instructions (Signed)

## 2023-11-09 NOTE — Progress Notes (Signed)
 Subjective:   Patient ID: Daniel [name], male   DOB: 53 y.o.   MRN: [id number]   HPI Patient presents with painful ingrown toenail that needs to be fixed left big toe and on the right has pain in the plantar heel that orthotics were made for and no other treatments.  Patient is in good health   ROS      Objective:  Physical Exam  Vascular status intact with the patient's left hallux lateral border painful and incurvated with no erythema edema drainage in the right heel sore     Assessment:  Ingrown toenail deformity left hallux lateral border with pain along with inflammation of the plantar heel right     Plan:  H&P reviewed condition and for the left I have recommended correction I explained procedure risk patient wants surgery and I infiltrated the left big toe 60 mg like Marcaine mixture sterile prep done and after he read then signed consent form I remove the lateral border with sterile equipment and exposed the matrix applied phenol 3 applications 30 seconds followed by alcohol lavage sterile dressing gave instructions on soaks wear dressing 24 hours take it off earlier if throbbing were to occur and encouraged to call with questions concerns.  For the right I did go ahead today and I injected the right plantar fascia at insertion 3 mg Kenalog 5 mg Xylocaine applied sterile dressing and instructed on see me back if symptoms were to persist for one of the other physicians in the group

## 2023-11-18 ENCOUNTER — Ambulatory Visit
Admission: RE | Admit: 2023-11-18 | Discharge: 2023-11-18 | Disposition: A | Discharge status: Home / Self Care | Source: Ambulatory Visit | Attending: Internal Medicine | Admitting: Internal Medicine

## 2023-11-18 DIAGNOSIS — I712 Thoracic aortic aneurysm, without rupture, unspecified: Secondary | ICD-10-CM

## 2023-11-18 MED ORDER — IOPAMIDOL (ISOVUE-370) INJECTION 76%
95.0000 mL | Freq: Once | INTRAVENOUS | Status: AC | PRN
  Administered 2023-11-18: 95 mL via INTRAVENOUS

## 2023-11-18 MED ORDER — IOPAMIDOL (ISOVUE-370) INJECTION 76%: 95.0000 mL | Freq: Once | INTRAVENOUS | Status: DC | PRN

## 2024-01-07 ENCOUNTER — Other Ambulatory Visit: Payer: Self-pay | Admitting: Internal Medicine

## 2024-01-07 DIAGNOSIS — R109 Unspecified abdominal pain: Secondary | ICD-10-CM

## 2024-01-18 ENCOUNTER — Ambulatory Visit
Admission: RE | Admit: 2024-01-18 | Discharge: 2024-01-18 | Disposition: A | Discharge status: Home / Self Care | Source: Ambulatory Visit | Attending: Internal Medicine | Admitting: Internal Medicine

## 2024-01-18 DIAGNOSIS — R109 Unspecified abdominal pain: Secondary | ICD-10-CM

## 2024-01-18 MED ORDER — IOPAMIDOL (ISOVUE-300) INJECTION 61%
100.0000 mL | Freq: Once | INTRAVENOUS | Status: AC | PRN
  Administered 2024-01-18: 100 mL via INTRAVENOUS

## 2024-09-07 ENCOUNTER — Ambulatory Visit (INDEPENDENT_AMBULATORY_CARE_PROVIDER_SITE_OTHER)

## 2024-09-07 ENCOUNTER — Ambulatory Visit: Admitting: Podiatry

## 2024-09-07 DIAGNOSIS — M722 Plantar fascial fibromatosis: Secondary | ICD-10-CM

## 2024-09-07 DIAGNOSIS — M659 Unspecified synovitis and tenosynovitis, unspecified site: Secondary | ICD-10-CM

## 2024-09-07 DIAGNOSIS — M7751 Other enthesopathy of right foot: Secondary | ICD-10-CM

## 2024-09-07 MED ORDER — DICLOFENAC SODIUM 75 MG PO TBEC: 75.0000 mg | DELAYED_RELEASE_TABLET | Freq: Two times a day (BID) | ORAL | 2 refills | Status: AC

## 2024-09-07 MED ORDER — TRIAMCINOLONE ACETONIDE 10 MG/ML IJ SUSP
10.0000 mg | Freq: Once | INTRAMUSCULAR | Status: AC
  Administered 2024-09-07: 10 mg via INTRA_ARTICULAR

## 2024-09-07 NOTE — Progress Notes (Signed)
 Subjective:   Patient ID: Daniel [name], male   DOB: 54 y.o.   MRN: [id number]   HPI Patient presents stating has had 2 areas of significant discomfort with 1 being in the forefoot around the 2nd and 3rd metatarsal phalangeal joint and secondarily in the right heel.  States that it is been sore and hard for him to bear weight on   ROS      Objective:  Physical Exam  Neurovascular status intact with inflammation pain of the 2nd and 3rd metatarsal phalangeal joint with fluid buildup and discomfort of a moderate nature in the plantar fascia right     Assessment:  Acute fasciitis symptoms right and inflammatory capsulitis 2nd and 3rd MPJ right which seems to be right now of a more prevalent nature     Plan:  H&P reviewed and I am going to try to address both conditions.  For the joint surfaces I did sterile prep I injected the capsule of the 2nd and 3rd MPJ periarticular 3 mg dexamethasone  Kenalog  5 mg Xylocaine  and I went ahead and for the plantar fascia I dispensed a fascial brace to elevate the arch and take stress off the tendon.  I placed on oral diclofenac  reappoint to recheck in 2 weeks  X-rays indicate small spur no indication stress fracture arthritis

## 2024-09-21 ENCOUNTER — Ambulatory Visit: Admitting: Podiatry

## 2024-10-09 ENCOUNTER — Ambulatory Visit: Admitting: Podiatry
